# Patient Record
Sex: Female | Born: 1990 | Race: Asian | Hispanic: No | Marital: Single | Smoking: Never smoker
Health system: Southern US, Community
[De-identification: ages and names within clinical notes are randomized; demographics above are authoritative.]

## PROBLEM LIST (undated history)

## (undated) ENCOUNTER — Inpatient Hospital Stay (HOSPITAL_COMMUNITY): Payer: Self-pay

## (undated) DIAGNOSIS — D242 Benign neoplasm of left breast: Secondary | ICD-10-CM

## (undated) DIAGNOSIS — Z332 Encounter for elective termination of pregnancy: Secondary | ICD-10-CM

## (undated) DIAGNOSIS — R519 Headache, unspecified: Secondary | ICD-10-CM

## (undated) DIAGNOSIS — R51 Headache: Secondary | ICD-10-CM

## (undated) HISTORY — PX: THERAPEUTIC ABORTION: SHX798

## (undated) HISTORY — PX: WISDOM TOOTH EXTRACTION: SHX21

---

## 2011-10-28 NOTE — L&D Delivery Note (Signed)
Delivery Note Pt developed a fever to 101 preceded by fetal tachycardia, received one dose of Unasyn for probable chorioamnionitis.  She progressed to complete and pushed well.  At 12:31 AM a viable female was delivered via Vaginal, Spontaneous Delivery (Presentation: Right Occiput Anterior).  APGAR: 8, 9; weight pending.  Terminal meconium.   Placenta status: Intact, Spontaneous Pathology.  Cord: 3 vessels with the following complications: None.  Anesthesia: Epidural  Episiotomy: None Lacerations: 2nd degree;Perineal Suture Repair: 3.0 vicryl rapide Est. Blood Loss (mL): 400  Mom to postpartum.  Baby to stay with mom. Declines circumcision.  Will give one more dose of Unasyn, then d/c, send placenta to pathology.  Montae Stager D 10/26/2012, 12:57 AM

## 2012-02-16 ENCOUNTER — Inpatient Hospital Stay (HOSPITAL_COMMUNITY)
Admission: AD | Admit: 2012-02-16 | Discharge: 2012-02-16 | Disposition: A | Discharge status: Home / Self Care | Payer: Medicaid Other | Source: Ambulatory Visit | Attending: Obstetrics and Gynecology | Admitting: Obstetrics and Gynecology

## 2012-02-16 ENCOUNTER — Inpatient Hospital Stay (HOSPITAL_COMMUNITY): Payer: Medicaid Other

## 2012-02-16 ENCOUNTER — Encounter (HOSPITAL_COMMUNITY): Payer: Self-pay | Admitting: *Deleted

## 2012-02-16 DIAGNOSIS — O26899 Other specified pregnancy related conditions, unspecified trimester: Secondary | ICD-10-CM

## 2012-02-16 DIAGNOSIS — R109 Unspecified abdominal pain: Secondary | ICD-10-CM

## 2012-02-16 DIAGNOSIS — O99891 Other specified diseases and conditions complicating pregnancy: Secondary | ICD-10-CM | POA: Insufficient documentation

## 2012-02-16 LAB — URINALYSIS, ROUTINE W REFLEX MICROSCOPIC
Glucose, UA: NEGATIVE mg/dL
Hgb urine dipstick: NEGATIVE
Ketones, ur: NEGATIVE mg/dL
Leukocytes, UA: NEGATIVE
Protein, ur: NEGATIVE mg/dL
pH: 6.5 (ref 5.0–8.0)

## 2012-02-16 LAB — WET PREP, GENITAL
Clue Cells Wet Prep HPF POC: NONE SEEN
Trich, Wet Prep: NONE SEEN

## 2012-02-16 LAB — DIFFERENTIAL
Basophils Absolute: 0 10*3/uL (ref 0.0–0.1)
Basophils Relative: 0 % (ref 0–1)
Eosinophils Relative: 1 % (ref 0–5)
Monocytes Absolute: 0.8 10*3/uL (ref 0.1–1.0)
Neutro Abs: 8.8 10*3/uL — ABNORMAL HIGH (ref 1.7–7.7)

## 2012-02-16 LAB — CBC
HCT: 41.9 % (ref 36.0–46.0)
MCHC: 33.4 g/dL (ref 30.0–36.0)
MCV: 84.3 fL (ref 78.0–100.0)
Platelets: 316 10*3/uL (ref 150–400)
RDW: 12.2 % (ref 11.5–15.5)
WBC: 13 10*3/uL — ABNORMAL HIGH (ref 4.0–10.5)

## 2012-02-16 NOTE — Discharge Instructions (Signed)
Abdominal Pain During Pregnancy  Abdominal discomfort is common in pregnancy. Most of the time, it does not cause harm. There are many causes of abdominal pain. Some causes are more serious than others. Some of the causes of abdominal pain in pregnancy are easily diagnosed. Occasionally, the diagnosis takes time to understand. Other times, the cause is not determined. Abdominal pain can be a sign that something is very wrong with the pregnancy, or the pain may have nothing to do with the pregnancy at all. For this reason, always tell your caregiver if you have any abdominal discomfort.  CAUSES  Common and harmless causes of abdominal pain include:   Constipation.   Excess gas and bloating.   Round ligament pain. This is pain that is felt in the folds of the groin.   The position the baby or placenta is in.   Baby kicks.   Braxton-Hicks contractions. These are mild contractions that do not cause cervical dilation.  Serious causes of abdominal pain include:   Ectopic pregnancy. This happens when a fertilized egg implants outside of the uterus.   Miscarriage.   Preterm labor. This is when labor starts at less than 37 weeks of pregnancy.   Placental abruption. This is when the placenta partially or completely separates from the uterus.   Preeclampsia. This is often associated with high blood pressure and has been referred to as "toxemia in pregnancy."   Uterine or amniotic fluid infections.  Causes unrelated to pregnancy include:   Urinary tract infection.   Gallbladder stones or inflammation.   Hepatitis or other liver illness.   Intestinal problems, stomach flu, food poisoning, or ulcer.   Appendicitis.   Kidney (renal) stones.   Kidney infection (pylonephritis).  HOME CARE INSTRUCTIONS   For mild pain:   Do not have sexual intercourse or put anything in your vagina until your symptoms go away completely.   Get plenty of rest until your pain improves. If your pain does not improve in 1 hour, call  your caregiver.   Drink clear fluids if you feel nauseous. Avoid solid food as long as you are uncomfortable or nauseous.   Only take medicine as directed by your caregiver.   Keep all follow-up appointments with your caregiver.  SEEK IMMEDIATE MEDICAL CARE IF:   You are bleeding, leaking fluid, or passing tissue from the vagina.   You have increasing pain or cramping.   You have persistent vomiting.   You have painful or bloody urination.   You have a fever.   You notice a decrease in your baby's movements.   You have extreme weakness or feel faint.   You have shortness of breath, with or without abdominal pain.   You develop a severe headache with abdominal pain.   You have abnormal vaginal discharge with abdominal pain.   You have persistent diarrhea.   You have abdominal pain that continues even after rest, or gets worse.  MAKE SURE YOU:    Understand these instructions.   Will watch your condition.   Will get help right away if you are not doing well or get worse.  Document Released: 10/13/2005 Document Revised: 10/02/2011 Document Reviewed: 05/09/2011  [employer] Patient Information 2012 [employer].

## 2012-02-16 NOTE — MAU Note (Signed)
abd cramping, ongoing past 3 wks.   [facility]

## 2012-02-16 NOTE — MAU Provider Note (Signed)
History     CSN: [id number]  Arrival date and time: 02/16/12 1107   First Provider Initiated Contact with Patient 02/16/12 1208      Chief Complaint  Patient presents with  . Abdominal Pain   HPI Jessica Warren is 21 y.o. G2P0010 [redacted]w[redacted]d weeks presenting with cramping that are more uncomfortable than period cramp. Bearable but "I have to pause"  Rates 5-6/10 when this happens.  Denies vaginal bleeding or discharge. Denies nausea and vomiting.   Unplanned pregnancy, not using contraception.  Medicaid is pending.  She hasn't decided where to get care.    Past Medical History  Diagnosis Date  . No pertinent past medical history     Past Surgical History  Procedure Date  . No past surgeries     History reviewed. No pertinent family history.  History  Substance Use Topics  . Smoking status: Never Smoker   . Smokeless tobacco: Not on file  . Alcohol Use: No    Allergies: No Known Allergies  Prescriptions prior to admission  Medication Sig Dispense Refill  . hydrocortisone cream 1 % Apply 1 application topically 2 (two) times daily as needed. For eczema      . Prenatal Vit-Fe Fumarate-FA (PRENATAL MULTIVITAMIN) TABS Take 1 tablet by mouth daily.        Review of Systems  Constitutional: Negative.   Gastrointestinal: Positive for nausea and abdominal pain (intermittent sharp, lower abdomen, occ upper abd). Negative for vomiting.  Genitourinary:       Neg for vaginal discharge or bleeding   Physical Exam   Blood pressure 133/67, pulse 78, temperature 98.3 F (36.8 C), temperature source Oral, resp. rate 20, height 5' 1.5" (1.562 m), weight 78.926 kg (174 lb), last menstrual period 12/18/2011.  Physical Exam  Constitutional: She is oriented to person, place, and time. She appears well-developed and well-nourished. No distress.  HENT:  Head: Normocephalic.  Neck: Normal range of motion.  Respiratory: Effort normal.  GI: Soft. She exhibits no mass. There is no  tenderness. There is no rebound and no guarding.  Genitourinary: Uterus is enlarged (slight enlargement). Uterus is not tender. No bleeding around the vagina. Vaginal discharge (scant, white without odor) found.  Neurological: She is alert and oriented to person, place, and time.  Skin: Skin is dry.  Psychiatric: She has a normal mood and affect. Her behavior is normal. Judgment and thought content normal.   Results for orders placed during the hospital encounter of 02/16/12 (from the past 24 hour(s))  URINALYSIS, ROUTINE W REFLEX MICROSCOPIC     Status: Normal   Collection Time   02/16/12 11:25 AM      Component Value Range   Color, Urine YELLOW  YELLOW    APPearance CLEAR  CLEAR    Specific Gravity, Urine 1.010  1.005 - 1.030    pH 6.5  5.0 - 8.0    Glucose, UA NEGATIVE  NEGATIVE (mg/dL)   Hgb urine dipstick NEGATIVE  NEGATIVE    Bilirubin Urine NEGATIVE  NEGATIVE    Ketones, ur NEGATIVE  NEGATIVE (mg/dL)   Protein, ur NEGATIVE  NEGATIVE (mg/dL)   Urobilinogen, UA 0.2  0.0 - 1.0 (mg/dL)   Nitrite NEGATIVE  NEGATIVE    Leukocytes, UA NEGATIVE  NEGATIVE   POCT PREGNANCY, URINE     Status: Abnormal   Collection Time   02/16/12 11:28 AM      Component Value Range   Preg Test, Ur POSITIVE (*) NEGATIVE   WET  PREP, GENITAL     Status: Abnormal   Collection Time   02/16/12 12:20 PM      Component Value Range   Yeast Wet Prep HPF POC NONE SEEN  NONE SEEN    Trich, Wet Prep NONE SEEN  NONE SEEN    Clue Cells Wet Prep HPF POC NONE SEEN  NONE SEEN    WBC, Wet Prep HPF POC FEW (*) NONE SEEN   CBC     Status: Abnormal   Collection Time   02/16/12 12:50 PM      Component Value Range   WBC 13.0 (*) 4.0 - 10.5 (K/uL)   RBC 4.97  3.87 - 5.11 (MIL/uL)   Hemoglobin 14.0  12.0 - 15.0 (g/dL)   HCT 16.1  09.6 - 04.5 (%)   MCV 84.3  78.0 - 100.0 (fL)   MCH 28.2  26.0 - 34.0 (pg)   MCHC 33.4  30.0 - 36.0 (g/dL)   RDW 40.9  81.1 - 91.4 (%)   Platelets 316  150 - 400 (K/uL)  DIFFERENTIAL      Status: Abnormal   Collection Time   02/16/12 12:50 PM      Component Value Range   Neutrophils Relative 68  43 - 77 (%)   Neutro Abs 8.8 (*) 1.7 - 7.7 (K/uL)   Lymphocytes Relative 25  12 - 46 (%)   Lymphs Abs 3.3  0.7 - 4.0 (K/uL)   Monocytes Relative 6  3 - 12 (%)   Monocytes Absolute 0.8  0.1 - 1.0 (K/uL)   Eosinophils Relative 1  0 - 5 (%)   Eosinophils Absolute 0.1  0.0 - 0.7 (K/uL)   Basophils Relative 0  0 - 1 (%)   Basophils Absolute 0.0  0.0 - 0.1 (K/uL)   ULTRASOUND:  Probably early IUGS, No YS or FP.  [redacted]w[redacted]d.  Simple cyst on right measuring 2.7cm.  EDD 10/19/12.  Recommendation to repeat U/S in 2 weeks  MAU Course  Procedures  GC/CHL to lab MDM  14:53 Reviewed plan of care for followup with Dr. Jolayne Panther, she agreed with 2 week follow up ultrasound.  BY LMP patient should have been [redacted]w[redacted]d but by U/S measurements is [redacted]w[redacted]d.  Patient has not had vaginal bleeding.  Will repeat ultrasound in 2 weeks for viability  Assessment and Plan  A:  Abdominal pain in early pregnancy  P:  Repeat U/S in 2 weeks         Instructed patient  To return for increased pain, vaginal bleeding.         Kellen Hover,EVE M 02/16/2012, 12:11 PM

## 2012-02-16 NOTE — MAU Note (Signed)
Denies any pain or burning with urination. No GI complaints.

## 2012-02-17 LAB — GC/CHLAMYDIA PROBE AMP, GENITAL: Chlamydia, DNA Probe: NEGATIVE

## 2012-02-17 NOTE — MAU Provider Note (Signed)
Agree with above note.  Jessica Warren 02/17/2012 6:54 AM

## 2012-03-01 ENCOUNTER — Inpatient Hospital Stay (HOSPITAL_COMMUNITY)
Admission: AD | Admit: 2012-03-01 | Discharge: 2012-03-01 | Disposition: A | Discharge status: Home / Self Care | Payer: Medicaid Other | Source: Ambulatory Visit | Attending: Obstetrics & Gynecology | Admitting: Obstetrics & Gynecology

## 2012-03-01 ENCOUNTER — Ambulatory Visit (HOSPITAL_COMMUNITY)
Admission: RE | Admit: 2012-03-01 | Discharge: 2012-03-01 | Disposition: A | Discharge status: Home / Self Care | Payer: Medicaid Other | Source: Ambulatory Visit | Attending: Gynecology | Admitting: Gynecology

## 2012-03-01 ENCOUNTER — Other Ambulatory Visit (HOSPITAL_COMMUNITY): Payer: Self-pay | Admitting: Gynecology

## 2012-03-01 ENCOUNTER — Ambulatory Visit (HOSPITAL_COMMUNITY): Payer: Medicaid Other

## 2012-03-01 ENCOUNTER — Encounter (HOSPITAL_COMMUNITY): Payer: Self-pay

## 2012-03-01 DIAGNOSIS — O26899 Other specified pregnancy related conditions, unspecified trimester: Secondary | ICD-10-CM

## 2012-03-01 DIAGNOSIS — Z1389 Encounter for screening for other disorder: Secondary | ICD-10-CM

## 2012-03-01 DIAGNOSIS — Z3490 Encounter for supervision of normal pregnancy, unspecified, unspecified trimester: Secondary | ICD-10-CM

## 2012-03-01 DIAGNOSIS — O26859 Spotting complicating pregnancy, unspecified trimester: Secondary | ICD-10-CM | POA: Insufficient documentation

## 2012-03-01 DIAGNOSIS — O99891 Other specified diseases and conditions complicating pregnancy: Secondary | ICD-10-CM | POA: Insufficient documentation

## 2012-03-01 DIAGNOSIS — O3680X0 Pregnancy with inconclusive fetal viability, not applicable or unspecified: Secondary | ICD-10-CM | POA: Insufficient documentation

## 2012-03-01 DIAGNOSIS — O209 Hemorrhage in early pregnancy, unspecified: Secondary | ICD-10-CM | POA: Insufficient documentation

## 2012-03-01 DIAGNOSIS — R109 Unspecified abdominal pain: Secondary | ICD-10-CM

## 2012-03-01 NOTE — MAU Provider Note (Signed)
Jessica Warren is a 21 y.o. female who returns to MAU for follow up ultrasound for viability. She reports no pain and minimal brown spotting.  Ultrasound today shows a 6 week 2 day viable IUP. She will start prenatal care.  I have reviewed this patient's vital signs, nurses notes, appropriate labs and imaging.

## 2012-03-01 NOTE — MAU Note (Signed)
Patient to MAU after ultrasound to confirm viability. Patient state she has brown spotting on and off but no pain.

## 2012-10-25 ENCOUNTER — Encounter (HOSPITAL_COMMUNITY): Payer: Self-pay | Admitting: *Deleted

## 2012-10-25 ENCOUNTER — Inpatient Hospital Stay (HOSPITAL_COMMUNITY)
Admission: AD | Admit: 2012-10-25 | Discharge: 2012-10-27 | DRG: 774 | Disposition: A | Discharge status: Home / Self Care | Payer: Medicaid Other | Source: Ambulatory Visit | Attending: Obstetrics and Gynecology | Admitting: Obstetrics and Gynecology

## 2012-10-25 ENCOUNTER — Inpatient Hospital Stay (HOSPITAL_COMMUNITY): Payer: Medicaid Other | Admitting: Anesthesiology

## 2012-10-25 ENCOUNTER — Encounter (HOSPITAL_COMMUNITY): Payer: Self-pay | Admitting: Anesthesiology

## 2012-10-25 DIAGNOSIS — IMO0001 Reserved for inherently not codable concepts without codable children: Secondary | ICD-10-CM

## 2012-10-25 DIAGNOSIS — O411090 Infection of amniotic sac and membranes, unspecified, unspecified trimester, not applicable or unspecified: Secondary | ICD-10-CM | POA: Diagnosis present

## 2012-10-25 LAB — OB RESULTS CONSOLE HIV ANTIBODY (ROUTINE TESTING): HIV: NONREACTIVE

## 2012-10-25 LAB — CBC
HCT: 40 % (ref 36.0–46.0)
MCH: 29.8 pg (ref 26.0–34.0)
MCV: 87 fL (ref 78.0–100.0)
RBC: 4.6 MIL/uL (ref 3.87–5.11)
WBC: 18.2 10*3/uL — ABNORMAL HIGH (ref 4.0–10.5)

## 2012-10-25 LAB — OB RESULTS CONSOLE ABO/RH: RH Type: POSITIVE

## 2012-10-25 LAB — OB RESULTS CONSOLE RUBELLA ANTIBODY, IGM: Rubella: IMMUNE

## 2012-10-25 LAB — OB RESULTS CONSOLE GBS: GBS: NEGATIVE

## 2012-10-25 LAB — PREPARE RBC (CROSSMATCH)

## 2012-10-25 MED ORDER — OXYCODONE-ACETAMINOPHEN 5-325 MG PO TABS: 1.0000 | ORAL_TABLET | ORAL | Status: DC | PRN

## 2012-10-25 MED ORDER — BUTORPHANOL TARTRATE 1 MG/ML IJ SOLN
1.0000 mg | INTRAMUSCULAR | Status: DC | PRN
  Administered 2012-10-25: 1 mg via INTRAVENOUS

## 2012-10-25 MED ORDER — LACTATED RINGERS IV SOLN: 500.0000 mL | Freq: Once | INTRAVENOUS | Status: DC

## 2012-10-25 MED ORDER — CITRIC ACID-SODIUM CITRATE 334-500 MG/5ML PO SOLN: 30.0000 mL | ORAL | Status: DC | PRN

## 2012-10-25 MED ORDER — ONDANSETRON HCL 4 MG/2ML IJ SOLN: 4.0000 mg | Freq: Four times a day (QID) | INTRAMUSCULAR | Status: DC | PRN

## 2012-10-25 MED ORDER — BUTORPHANOL TARTRATE 1 MG/ML IJ SOLN
INTRAMUSCULAR | Status: AC
  Administered 2012-10-25: 1 mg via INTRAVENOUS
  Filled 2012-10-25: qty 1

## 2012-10-25 MED ORDER — PHENYLEPHRINE 40 MCG/ML (10ML) SYRINGE FOR IV PUSH (FOR BLOOD PRESSURE SUPPORT): 80.0000 ug | PREFILLED_SYRINGE | INTRAVENOUS | Status: DC | PRN

## 2012-10-25 MED ORDER — LACTATED RINGERS IV SOLN
INTRAVENOUS | Status: DC
  Administered 2012-10-25 (×3): via INTRAVENOUS

## 2012-10-25 MED ORDER — DIPHENHYDRAMINE HCL 50 MG/ML IJ SOLN: 12.5000 mg | INTRAMUSCULAR | Status: DC | PRN

## 2012-10-25 MED ORDER — IBUPROFEN 600 MG PO TABS
600.0000 mg | ORAL_TABLET | Freq: Four times a day (QID) | ORAL | Status: DC | PRN
  Administered 2012-10-26: 600 mg via ORAL
  Filled 2012-10-25: qty 1

## 2012-10-25 MED ORDER — SODIUM CHLORIDE 0.9 % IV SOLN
3.0000 g | Freq: Once | INTRAVENOUS | Status: AC
  Administered 2012-10-25: 3 g via INTRAVENOUS
  Filled 2012-10-25: qty 3

## 2012-10-25 MED ORDER — PHENYLEPHRINE 40 MCG/ML (10ML) SYRINGE FOR IV PUSH (FOR BLOOD PRESSURE SUPPORT)
80.0000 ug | PREFILLED_SYRINGE | INTRAVENOUS | Status: DC | PRN
  Filled 2012-10-25: qty 5

## 2012-10-25 MED ORDER — FENTANYL 2.5 MCG/ML BUPIVACAINE 1/10 % EPIDURAL INFUSION (WH - ANES)
14.0000 mL/h | INTRAMUSCULAR | Status: DC
  Administered 2012-10-25 (×2): 14 mL/h via EPIDURAL
  Filled 2012-10-25 (×2): qty 125

## 2012-10-25 MED ORDER — ACETAMINOPHEN 325 MG PO TABS
650.0000 mg | ORAL_TABLET | ORAL | Status: DC | PRN
  Filled 2012-10-25: qty 2

## 2012-10-25 MED ORDER — EPHEDRINE 5 MG/ML INJ
10.0000 mg | INTRAVENOUS | Status: DC | PRN
  Filled 2012-10-25: qty 4

## 2012-10-25 MED ORDER — OXYTOCIN BOLUS FROM INFUSION
500.0000 mL | INTRAVENOUS | Status: DC
  Administered 2012-10-26: 500 mL via INTRAVENOUS

## 2012-10-25 MED ORDER — LIDOCAINE HCL (PF) 1 % IJ SOLN
30.0000 mL | INTRAMUSCULAR | Status: DC | PRN
  Filled 2012-10-25: qty 30

## 2012-10-25 MED ORDER — LACTATED RINGERS IV SOLN
500.0000 mL | INTRAVENOUS | Status: DC | PRN
  Administered 2012-10-25: 500 mL via INTRAVENOUS

## 2012-10-25 MED ORDER — SODIUM BICARBONATE 8.4 % IV SOLN
INTRAVENOUS | Status: DC | PRN
  Administered 2012-10-25: 5 mL via EPIDURAL

## 2012-10-25 MED ORDER — OXYTOCIN 40 UNITS IN LACTATED RINGERS INFUSION - SIMPLE MED
62.5000 mL/h | INTRAVENOUS | Status: DC
  Filled 2012-10-25: qty 1000

## 2012-10-25 MED ORDER — ACETAMINOPHEN 500 MG PO TABS
1000.0000 mg | ORAL_TABLET | Freq: Once | ORAL | Status: AC
  Administered 2012-10-25: 1000 mg via ORAL
  Filled 2012-10-25: qty 2

## 2012-10-25 MED ORDER — EPHEDRINE 5 MG/ML INJ: 10.0000 mg | INTRAVENOUS | Status: DC | PRN

## 2012-10-25 NOTE — H&P (Signed)
Jessica Warren is a 21 y.o. female, G2 P0010, EGA 40+ weeks with EDC 12-28 presenting for evaluation of ctx.  In MAU, VE initially 1 cm, changed to 3 cm after walking.  Prenatal care essentially uncomplicated, see prenatal records for complete history.  Maternal Medical History:  Reason for admission: Reason for admission: contractions.  Contractions: Frequency: regular.   Perceived severity is moderate.    Fetal activity: Perceived fetal activity is normal.    Prenatal complications: no prenatal complications   OB History    Grav Para Term Preterm Abortions TAB SAB Ect Mult Living   2    1 1     0     Past Medical History  Diagnosis Date  . No pertinent past medical history    Past Surgical History  Procedure Date  . Therapeutic abortion    Family History: family history is negative for Other. Social History:  reports that she has never smoked. She has never used smokeless tobacco. She reports that she drinks alcohol. She reports that she does not use illicit drugs.   Prenatal Transfer Tool  Maternal Diabetes: No Genetic Screening: Normal Maternal Ultrasounds/Referrals: Normal Fetal Ultrasounds or other Referrals:  None Maternal Substance Abuse:  No Significant Maternal Medications:  None Significant Maternal Lab Results:  Lab values include: Group B Strep negative Other Comments:  None  Review of Systems  Respiratory: Negative.   Cardiovascular: Negative.    VE- 3-4/C/_1, vtx, AROM clear Dilation: 3 (tight) Effacement (%): 100 Station: -2 Exam by:: jolynn Blood pressure 112/65, pulse 78, temperature 98.3 F (36.8 C), temperature source Oral, resp. rate 18, height 5' 1.5" (1.562 m), weight 88.542 kg (195 lb 3.2 oz), last menstrual period 12/18/2011. Maternal Exam:  Uterine Assessment: Contraction strength is moderate.  Contraction frequency is regular.   Abdomen: Patient reports no abdominal tenderness. Estimated fetal weight is 8 lbs.   Fetal presentation:  vertex  Introitus: Normal vulva. Normal vagina.  Amniotic fluid character: clear.  Pelvis: adequate for delivery.   Cervix: Cervix evaluated by digital exam.     Fetal Exam Fetal Monitor Review: Mode: ultrasound.   Baseline rate: 130.  Variability: moderate (6-25 bpm).   Pattern: accelerations present and no decelerations.    Fetal State Assessment: Category I - tracings are normal.     Physical Exam  Constitutional: She appears well-developed and well-nourished.  Cardiovascular: Normal rate, regular rhythm and normal heart sounds.   No murmur heard. Respiratory: Breath sounds normal. No respiratory distress. She has no wheezes.  GI: Soft.       gravid    Prenatal labs: ABO, Rh: A/Positive/-- (12/30 0000) Antibody: Negative (12/30 0000) Rubella: Immune (12/30 0000) RPR: Nonreactive (12/30 0000)  HBsAg: Negative (12/30 0000)  HIV: Non-reactive (12/30 0000)  GBS:   Neg GCT:  Elevated, nl GTT  Assessment/Plan: IUP at 40+ weeks in early labor, AROM done for augmentation, monitor progress.     Faelyn Sigler D 10/25/2012, 1:11 PM

## 2012-10-25 NOTE — Anesthesia Procedure Notes (Signed)

## 2012-10-25 NOTE — Progress Notes (Signed)
Comfortable with epidural Afeb, VSS FHT- Cat I VE- 6/C/0, vtx Continue to monitor progress

## 2012-10-25 NOTE — Anesthesia Preprocedure Evaluation (Signed)

## 2012-10-25 NOTE — MAU Note (Signed)
Patient states she is having contractions every 5-6 minutes with bloody show. Denies leaking. States she has been feeling fetal movement.

## 2012-10-25 NOTE — MAU Note (Signed)
Regular contractions since 0530. Now every 5-6 min.  First preg, no problems with preg.  No bleeding or leaking.

## 2012-10-26 ENCOUNTER — Encounter (HOSPITAL_COMMUNITY): Payer: Self-pay | Admitting: Obstetrics

## 2012-10-26 MED ORDER — METHYLERGONOVINE MALEATE 0.2 MG/ML IJ SOLN: 0.2000 mg | INTRAMUSCULAR | Status: DC | PRN

## 2012-10-26 MED ORDER — DIBUCAINE 1 % RE OINT: 1.0000 "application " | TOPICAL_OINTMENT | RECTAL | Status: DC | PRN

## 2012-10-26 MED ORDER — PRENATAL MULTIVITAMIN CH
1.0000 | ORAL_TABLET | Freq: Every day | ORAL | Status: DC
  Administered 2012-10-26 – 2012-10-27 (×2): 1 via ORAL
  Filled 2012-10-26 (×2): qty 1

## 2012-10-26 MED ORDER — TETANUS-DIPHTH-ACELL PERTUSSIS 5-2.5-18.5 LF-MCG/0.5 IM SUSP: 0.5000 mL | Freq: Once | INTRAMUSCULAR | Status: DC

## 2012-10-26 MED ORDER — METHYLERGONOVINE MALEATE 0.2 MG PO TABS: 0.2000 mg | ORAL_TABLET | ORAL | Status: DC | PRN

## 2012-10-26 MED ORDER — MAGNESIUM HYDROXIDE 400 MG/5ML PO SUSP: 30.0000 mL | ORAL | Status: DC | PRN

## 2012-10-26 MED ORDER — OXYCODONE-ACETAMINOPHEN 5-325 MG PO TABS: 1.0000 | ORAL_TABLET | ORAL | Status: DC | PRN

## 2012-10-26 MED ORDER — SIMETHICONE 80 MG PO CHEW: 80.0000 mg | CHEWABLE_TABLET | ORAL | Status: DC | PRN

## 2012-10-26 MED ORDER — ZOLPIDEM TARTRATE 5 MG PO TABS: 5.0000 mg | ORAL_TABLET | Freq: Every evening | ORAL | Status: DC | PRN

## 2012-10-26 MED ORDER — MEASLES, MUMPS & RUBELLA VAC ~~LOC~~ INJ
0.5000 mL | INJECTION | Freq: Once | SUBCUTANEOUS | Status: DC
  Filled 2012-10-26: qty 0.5

## 2012-10-26 MED ORDER — SODIUM CHLORIDE 0.9 % IV SOLN
3.0000 g | Freq: Once | INTRAVENOUS | Status: AC
  Administered 2012-10-26: 3 g via INTRAVENOUS
  Filled 2012-10-26: qty 3

## 2012-10-26 MED ORDER — ONDANSETRON HCL 4 MG PO TABS: 4.0000 mg | ORAL_TABLET | ORAL | Status: DC | PRN

## 2012-10-26 MED ORDER — LANOLIN HYDROUS EX OINT: TOPICAL_OINTMENT | CUTANEOUS | Status: DC | PRN

## 2012-10-26 MED ORDER — WITCH HAZEL-GLYCERIN EX PADS: 1.0000 "application " | MEDICATED_PAD | CUTANEOUS | Status: DC | PRN

## 2012-10-26 MED ORDER — SENNOSIDES-DOCUSATE SODIUM 8.6-50 MG PO TABS
2.0000 | ORAL_TABLET | Freq: Every day | ORAL | Status: DC
  Administered 2012-10-26: 2 via ORAL

## 2012-10-26 MED ORDER — DIPHENHYDRAMINE HCL 25 MG PO CAPS: 25.0000 mg | ORAL_CAPSULE | Freq: Four times a day (QID) | ORAL | Status: DC | PRN

## 2012-10-26 MED ORDER — ONDANSETRON HCL 4 MG/2ML IJ SOLN: 4.0000 mg | INTRAMUSCULAR | Status: DC | PRN

## 2012-10-26 MED ORDER — BENZOCAINE-MENTHOL 20-0.5 % EX AERO
1.0000 "application " | INHALATION_SPRAY | CUTANEOUS | Status: DC | PRN
  Filled 2012-10-26: qty 56

## 2012-10-26 MED ORDER — IBUPROFEN 600 MG PO TABS
600.0000 mg | ORAL_TABLET | Freq: Four times a day (QID) | ORAL | Status: DC
  Administered 2012-10-26 – 2012-10-27 (×5): 600 mg via ORAL
  Filled 2012-10-26 (×5): qty 1

## 2012-10-26 NOTE — Progress Notes (Signed)
PPD #0 No problems Afeb-last temp 100.4 at 0130, VSS Fundus firm, NT at U-1 Continue routine postpartum care, monitor temp, is off antibiotics

## 2012-10-26 NOTE — Progress Notes (Signed)
UR chart review completed.  

## 2012-10-26 NOTE — Anesthesia Postprocedure Evaluation (Signed)
  Anesthesia Post-op Note  Patient: Jessica Warren  Procedure(s) Performed: * No procedures listed *  Patient Location: Mother/Baby  Anesthesia Type:Epidural  Level of Consciousness: awake, alert  and oriented  Airway and Oxygen Therapy: Patient Spontanous Breathing  Post-op Pain: mild  Post-op Assessment: Post-op Vital signs reviewed  Post-op Vital Signs: Reviewed and stable  Complications: No apparent anesthesia complications

## 2012-10-27 MED ORDER — IBUPROFEN 600 MG PO TABS: 600.0000 mg | ORAL_TABLET | Freq: Four times a day (QID) | ORAL | Status: DC

## 2012-10-27 MED ORDER — OXYCODONE-ACETAMINOPHEN 5-325 MG PO TABS: 1.0000 | ORAL_TABLET | ORAL | Status: DC | PRN

## 2012-10-27 NOTE — Progress Notes (Signed)
Post Partum Day 1 Subjective: no complaints and tolerating PO Pt requests d/c today if baby able to go  Objective: Blood pressure 112/70, pulse 81, temperature 97.6 F (36.4 C), temperature source Oral, resp. rate 18, height 5' 1.5" (1.562 m), weight 88.542 kg (195 lb 3.2 oz), last menstrual period 12/18/2011, unknown if currently breastfeeding.  Physical Exam:  General: alert and cooperative Lochia: appropriate Uterine Fundus: firm    Basename 10/25/12 1250  HGB 13.7  HCT 40.0    Assessment/Plan: Discharge home if baby able to go Motrin and percocet F/u 6 weeks   LOS: 2 days   Azia Toutant W 10/27/2012, 9:31 AM

## 2012-10-27 NOTE — Discharge Summary (Signed)
Obstetric Discharge Summary Reason for Admission: onset of labor Prenatal Procedures: none Intrapartum Procedures: spontaneous vaginal delivery, fever in labor Postpartum Procedures: antibiotics Complications-Operative and Postpartum: second degree perineal laceration Hemoglobin  Date Value Range Status  10/25/2012 13.7  12.0 - 15.0 g/dL Final     HCT  Date Value Range Status  10/25/2012 40.0  36.0 - 46.0 % Final    Physical Exam:  General: alert and cooperative Lochia: appropriate Uterine Fundus: firm   Discharge Diagnoses: Term Pregnancy-delivered        Chorioamnionitis in labor, treated with Unasyn Discharge Information: Date: 10/27/2012 Activity: pelvic rest Diet: routine Medications: Ibuprofen and Percocet Condition: improved Instructions: refer to practice specific booklet Discharge to: home Follow-up Information    Follow up with MEISINGER,TODD D, MD. Schedule an appointment as soon as possible for a visit in 6 weeks.   Contact information:   [address] [place] [address] [phone]          Newborn Data: Live born female  Birth Weight: 7 lb 6.4 oz (3357 g) APGAR: 8, 9  Home with mother.  Jessica Warren 10/27/2012, 9:34 AM

## 2012-10-28 LAB — TYPE AND SCREEN

## 2013-06-30 ENCOUNTER — Other Ambulatory Visit: Payer: Self-pay | Admitting: Radiology

## 2013-08-29 LAB — OB RESULTS CONSOLE GC/CHLAMYDIA
CHLAMYDIA, DNA PROBE: NEGATIVE
Gonorrhea: NEGATIVE

## 2013-08-29 LAB — OB RESULTS CONSOLE ABO/RH: RH Type: POSITIVE

## 2013-08-29 LAB — OB RESULTS CONSOLE RPR: RPR: NONREACTIVE

## 2013-08-29 LAB — OB RESULTS CONSOLE HIV ANTIBODY (ROUTINE TESTING): HIV: NONREACTIVE

## 2013-08-29 LAB — OB RESULTS CONSOLE HEPATITIS B SURFACE ANTIGEN: Hepatitis B Surface Ag: NEGATIVE

## 2013-08-29 LAB — OB RESULTS CONSOLE RUBELLA ANTIBODY, IGM: RUBELLA: IMMUNE

## 2013-08-29 LAB — OB RESULTS CONSOLE ANTIBODY SCREEN: Antibody Screen: NEGATIVE

## 2013-10-27 NOTE — L&D Delivery Note (Signed)
Delivery Note At 2:38 AM a viable and healthy female was delivered via  (Presentation: OA  ).  APGAR: 9, 9; weight P.   Placenta status:delivered, intact .  Cord: 3VC with the following complications: none  Anesthesia:  epidural Episiotomy:  none Lacerations: periurethral Suture Repair: 3.0 vicryl rapide Est. Blood Loss (mL): 400cc  Mom to postpartum.  Baby to Couplet care / Skin to Skin.  Salomon Ganser Bovard-Stuckert 03/24/2014, 2:59 AM  Bo/A+/Contra Mirena/ RI

## 2013-12-27 LAB — OB RESULTS CONSOLE GBS: GBS: POSITIVE

## 2014-02-24 IMAGING — US US OB TRANSVAGINAL
1 series · 14 of 25 positions shown · non-contrast
Comparison: none

[Series 1: us ob transvaginal · 14 of 25 slices shown]
[im 1/25]
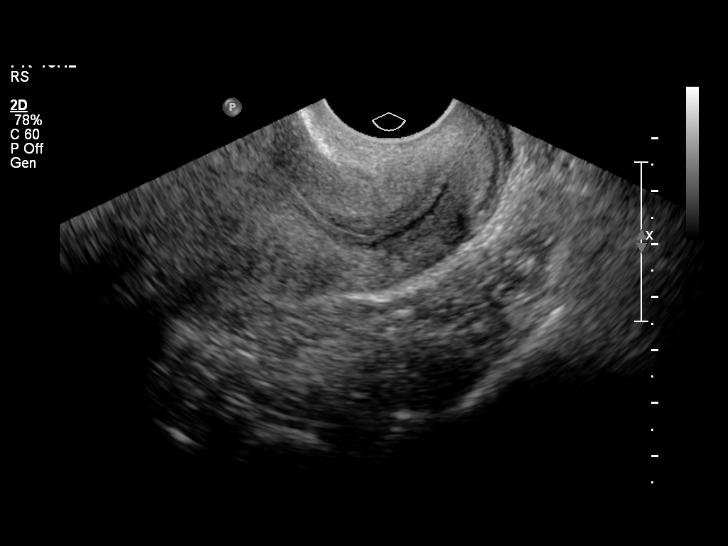
[im 3/25]
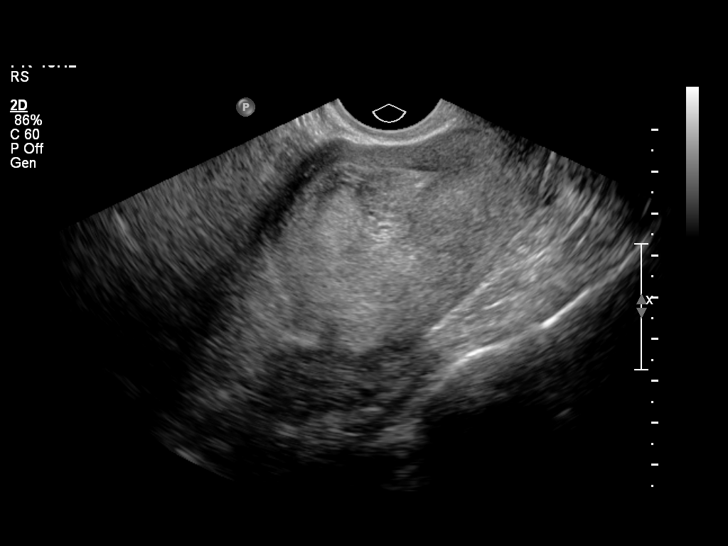
[im 5/25]
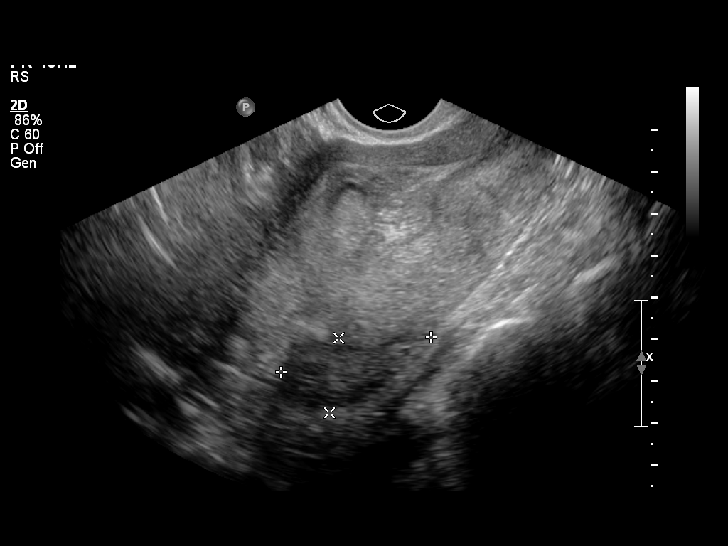
[im 7/25]
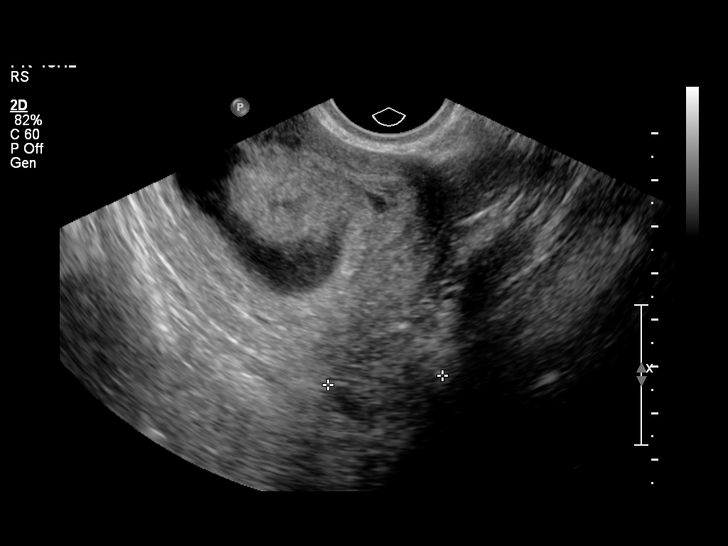
[im 9/25]
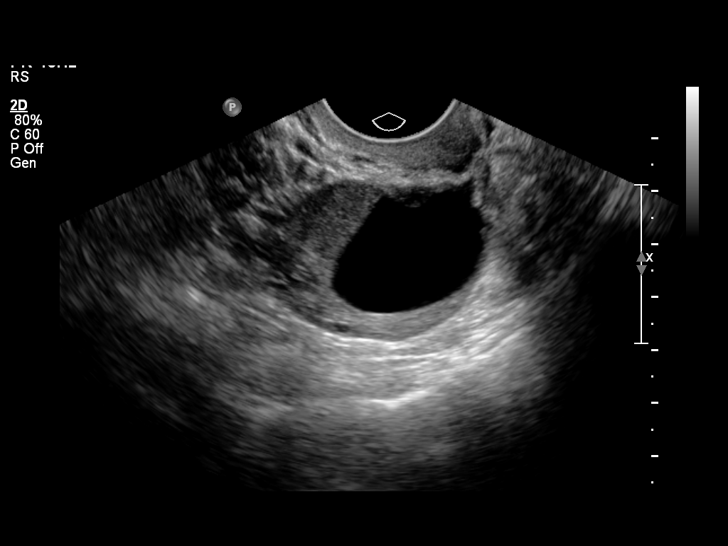
[im 10/25]
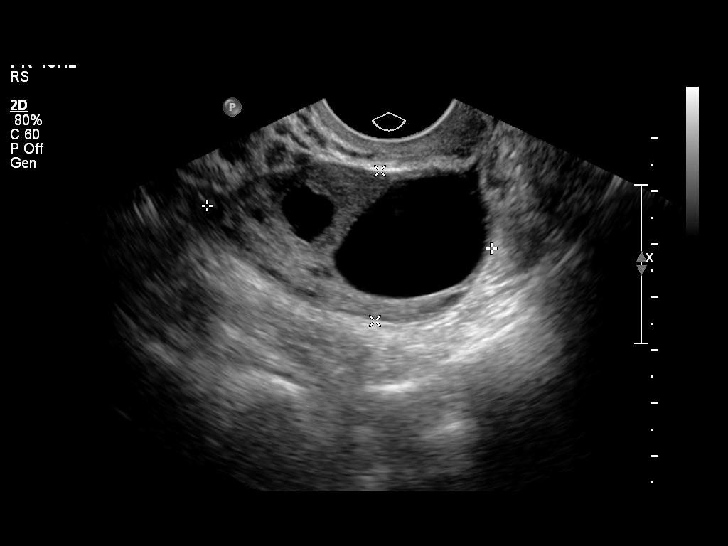
[im 12/25]
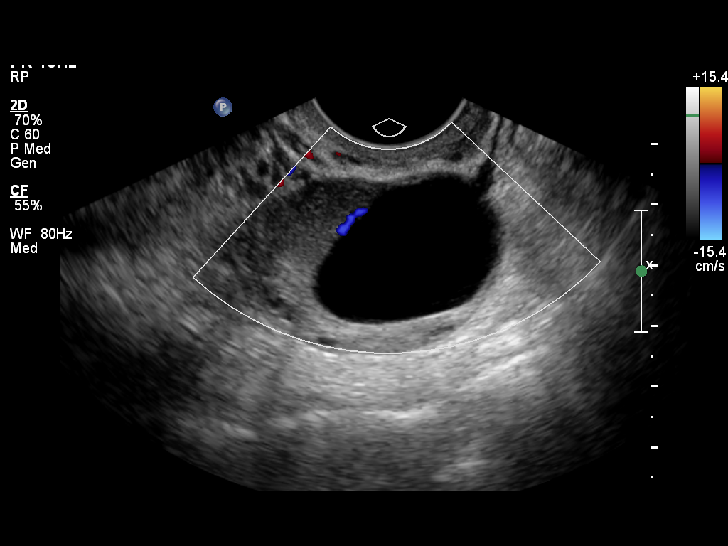
[im 14/25]
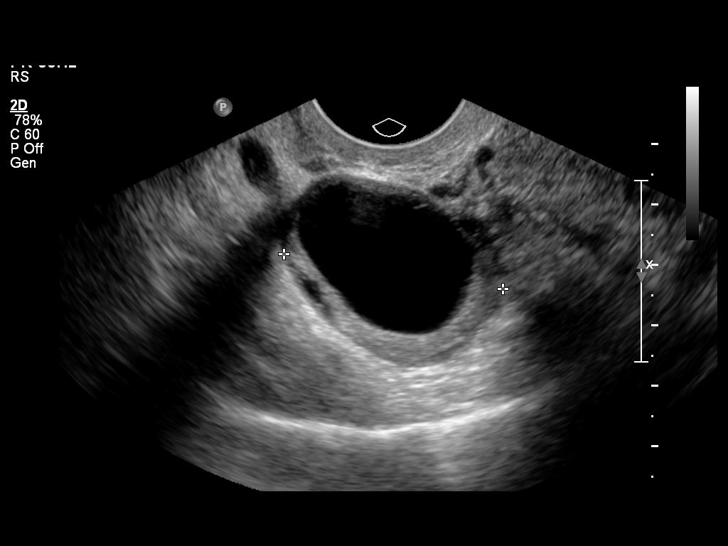
[im 16/25]
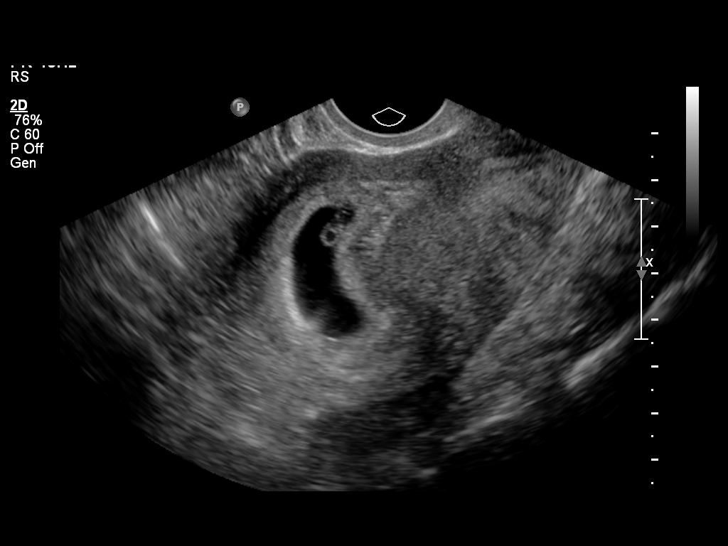
[im 17/25]
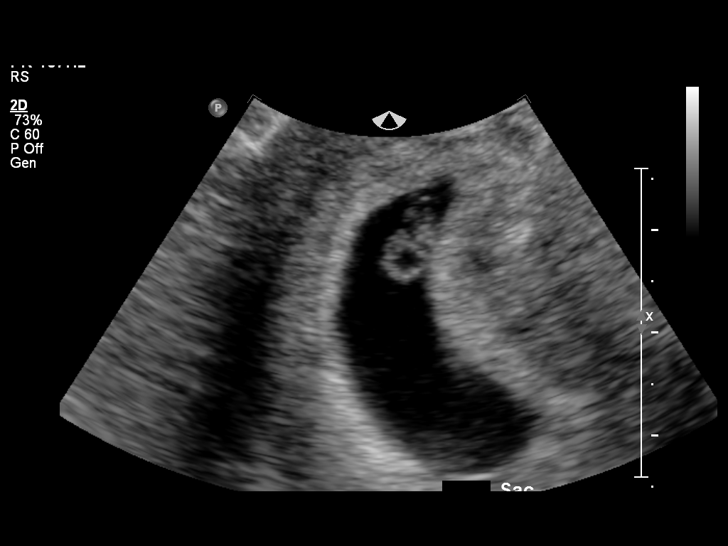
[im 19/25]
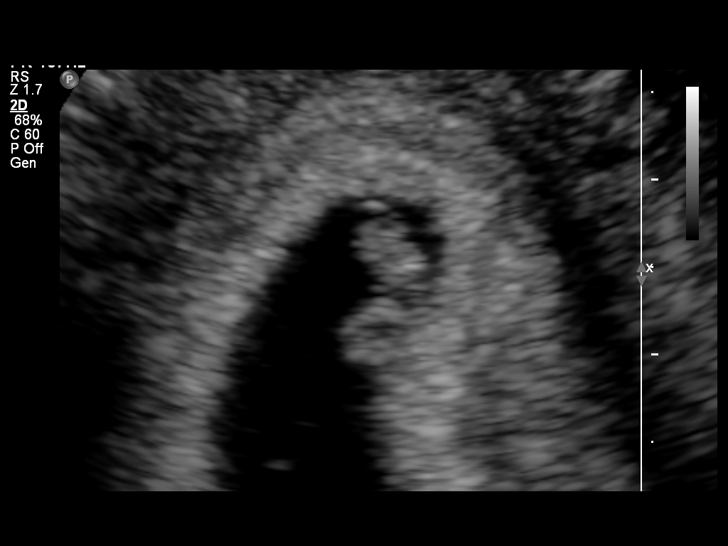
[im 21/25]
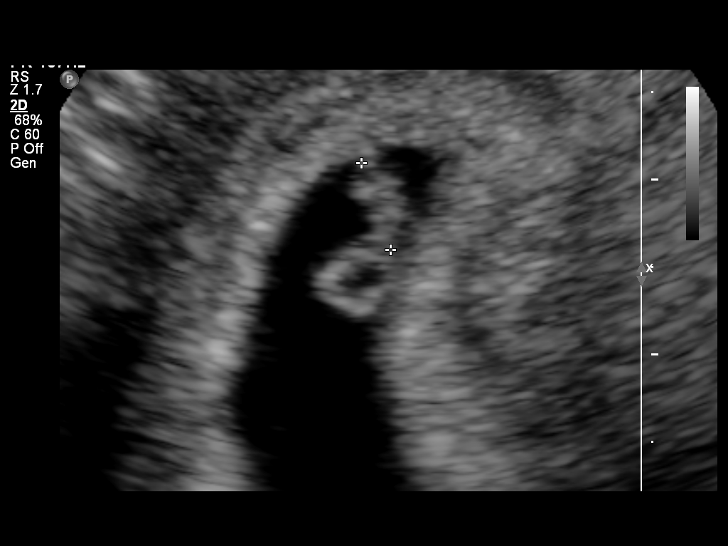
[im 23/25]
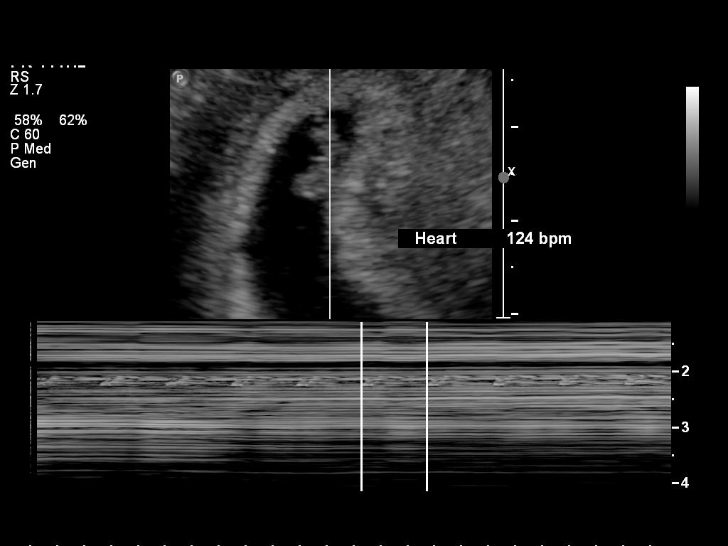
[im 25/25]
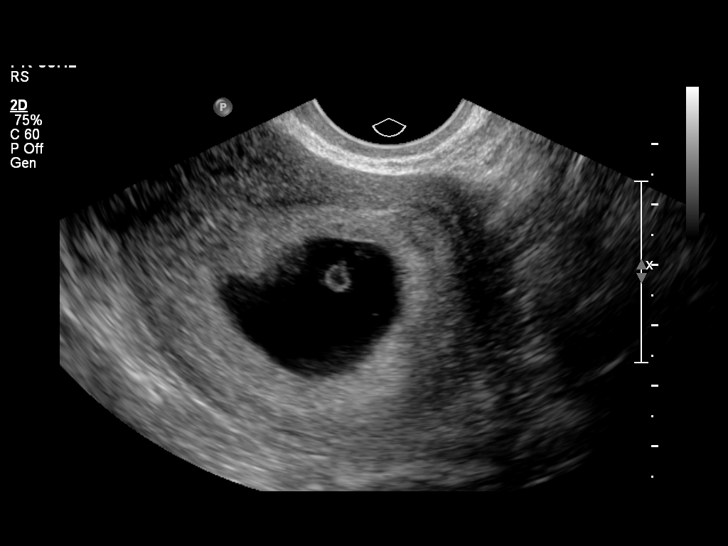

[14 of 25 positions shown; findings below may reference images not displayed]

OBSTETRICS REPORT
                      (Signed Final 03/01/2012 [DATE])

 Order#:         [id number]
Procedures

 US OB TRANSVAGINAL                                    76817.0
Indications

 Pregnancy with inconclusive fetal viability
 Vaginal bleeding, unknown etiology
Fetal Evaluation

 Preg. Location:    Intrauterine
 Gest. Sac:         Intrauterine
 Yolk Sac:          Visualized
 Fetal Pole:        Visualized
 Fetal Heart Rate:  124                         bpm
 Cardiac Activity:  Observed
Biometry

 CRL:      5.3  mm    G. Age:   6w 2d                  EDD:   10/23/12
Gestational Age

 LMP:           10w 4d       Date:   12/18/11                 EDD:   09/23/12
 Best:          6w 2d     Det. By:   U/S C R L (03/01/12)     EDD:   10/23/12
Cervix Uterus Adnexa

 Left Ovary:   Size(cm) L: 3.68 x W: 2.46 x H: 1.8  Volume(cc):
 Right Ovary:  Size(cm) L: 5.43 x W: 3.67 x H: 2.83  Volume(cc):
               29.5  Small corpus luteum noted.
Impression

 Intrauterine gestational sac, yolk sac, fetal pole, and cardiac
 activity noted.  6w2d by today's CRL. No acute abnormality.

 questions or concerns.

## 2014-03-22 ENCOUNTER — Encounter (HOSPITAL_COMMUNITY): Payer: Self-pay | Admitting: *Deleted

## 2014-03-22 ENCOUNTER — Inpatient Hospital Stay (HOSPITAL_COMMUNITY)
Admission: AD | Admit: 2014-03-22 | Discharge: 2014-03-22 | Disposition: A | Discharge status: Home / Self Care | Payer: BC Managed Care – PPO | Source: Ambulatory Visit | Attending: Obstetrics and Gynecology | Admitting: Obstetrics and Gynecology

## 2014-03-22 DIAGNOSIS — O368190 Decreased fetal movements, unspecified trimester, not applicable or unspecified: Secondary | ICD-10-CM | POA: Insufficient documentation

## 2014-03-22 DIAGNOSIS — O368130 Decreased fetal movements, third trimester, not applicable or unspecified: Secondary | ICD-10-CM

## 2014-03-22 DIAGNOSIS — O4690 Antepartum hemorrhage, unspecified, unspecified trimester: Secondary | ICD-10-CM | POA: Insufficient documentation

## 2014-03-22 DIAGNOSIS — O479 False labor, unspecified: Secondary | ICD-10-CM | POA: Insufficient documentation

## 2014-03-22 NOTE — MAU Note (Signed)
States she has had decreased FM for a week. Had NST in office yesterday as well as cervical exam. States she was told last week she was 1cm and yesterday was told "a little bit more."

## 2014-03-22 NOTE — MAU Provider Note (Signed)
  History     CSN: [id number]  Arrival date and time: 03/22/14 1430   First Provider Initiated Contact with Patient 03/22/14 1531      Chief Complaint  Patient presents with  . Vaginal Bleeding  . Decreased Fetal Movement   HPI This is a 23 y.o. female at [redacted]w[redacted]d who presents with report of decreased fetal movement.   RN Note:  States she has had decreased FM for a week. Had NST in office yesterday as well as cervical exam. States she was told last week she was 1cm and yesterday was told "a little bit more."       OB History   Grav Para Term Preterm Abortions TAB SAB Ect Mult Living   3 2 2  1 1    2       Past Medical History  Diagnosis Date  . No pertinent past medical history     Past Surgical History  Procedure Laterality Date  . Therapeutic abortion      Family History  Problem Relation Age of Onset  . Other Neg Hx     History  Substance Use Topics  . Smoking status: Never Smoker   . Smokeless tobacco: Never Used  . Alcohol Use: Yes     Comment: none with preg    Allergies:  Allergies  Allergen Reactions  . Adhesive [Tape] Hives and Itching    Prescriptions prior to admission  Medication Sig Dispense Refill  . Prenatal Vit-Fe Fumarate-FA (PRENATAL MULTIVITAMIN) TABS Take 1 tablet by mouth daily.        Review of Systems  Constitutional: Negative for fever and chills.  Gastrointestinal: Negative for nausea, vomiting and abdominal pain (but occasional contractions).  Neurological: Negative for dizziness.   Physical Exam   Blood pressure 109/69, pulse 99, temperature 98.5 F (36.9 C), temperature source Oral, resp. rate 16, height 5\' 2"  (1.575 m), weight 89.812 kg (198 lb), unknown if currently breastfeeding.  Physical Exam  Constitutional: She is oriented to person, place, and time. She appears well-developed and well-nourished. No distress.  Cardiovascular: Normal rate.   Respiratory: Effort normal.  GI: Soft. There is no tenderness. There  is no rebound and no guarding.  Genitourinary:  Dilation: 2 Effacement (%): 80 Cervical Position: Posterior Station: -3 Presentation: Vertex Exam by:: Sadiyah Kangas CNM   Musculoskeletal: Normal range of motion.  Neurological: She is alert and oriented to person, place, and time.  Skin: Skin is warm and dry.  Psychiatric: She has a normal mood and affect.    MAU Course  Procedures  MDM Fetal heart rate reactive Irregular mild contractions  Assessment and Plan  A:  SIUP at [redacted]w[redacted]d      Decreased fetal movement      Reassuring category I FHR tracing  P:  Discharge home      Labor precautions  Seabron Spates 03/22/2014, 3:48 PM

## 2014-03-22 NOTE — Discharge Instructions (Signed)

## 2014-03-22 NOTE — MAU Note (Signed)
States she noted some bright red blood this morning, enough to come out into toilet, but just a small amount of pink on tissue. Now is brown. Baby not moving as much.

## 2014-03-23 ENCOUNTER — Inpatient Hospital Stay (HOSPITAL_COMMUNITY): Payer: BC Managed Care – PPO | Admitting: Anesthesiology

## 2014-03-23 ENCOUNTER — Inpatient Hospital Stay (HOSPITAL_COMMUNITY)
Admission: AD | Admit: 2014-03-23 | Discharge: 2014-03-26 | DRG: 775 | Disposition: A | Discharge status: Home / Self Care | Payer: BC Managed Care – PPO | Source: Ambulatory Visit | Attending: Obstetrics and Gynecology | Admitting: Obstetrics and Gynecology

## 2014-03-23 ENCOUNTER — Encounter (HOSPITAL_COMMUNITY): Payer: BC Managed Care – PPO | Admitting: Anesthesiology

## 2014-03-23 ENCOUNTER — Encounter (HOSPITAL_COMMUNITY): Payer: Self-pay | Admitting: General Practice

## 2014-03-23 DIAGNOSIS — Z22330 Carrier of Group B streptococcus: Secondary | ICD-10-CM

## 2014-03-23 DIAGNOSIS — O99892 Other specified diseases and conditions complicating childbirth: Secondary | ICD-10-CM | POA: Diagnosis present

## 2014-03-23 DIAGNOSIS — IMO0001 Reserved for inherently not codable concepts without codable children: Secondary | ICD-10-CM

## 2014-03-23 DIAGNOSIS — O9989 Other specified diseases and conditions complicating pregnancy, childbirth and the puerperium: Secondary | ICD-10-CM

## 2014-03-23 LAB — CBC
HEMATOCRIT: 43 % (ref 36.0–46.0)
HEMOGLOBIN: 14.3 g/dL (ref 12.0–15.0)
MCH: 28.8 pg (ref 26.0–34.0)
MCHC: 33.3 g/dL (ref 30.0–36.0)
MCV: 86.7 fL (ref 78.0–100.0)
Platelets: 243 10*3/uL (ref 150–400)
RBC: 4.96 MIL/uL (ref 3.87–5.11)
RDW: 14.1 % (ref 11.5–15.5)
WBC: 17.2 10*3/uL — ABNORMAL HIGH (ref 4.0–10.5)

## 2014-03-23 MED ORDER — PHENYLEPHRINE 40 MCG/ML (10ML) SYRINGE FOR IV PUSH (FOR BLOOD PRESSURE SUPPORT)
80.0000 ug | PREFILLED_SYRINGE | INTRAVENOUS | Status: DC | PRN
  Filled 2014-03-23: qty 2
  Filled 2014-03-23: qty 10

## 2014-03-23 MED ORDER — LACTATED RINGERS IV SOLN
500.0000 mL | Freq: Once | INTRAVENOUS | Status: AC
  Administered 2014-03-23: 500 mL via INTRAVENOUS

## 2014-03-23 MED ORDER — LIDOCAINE HCL (PF) 1 % IJ SOLN
30.0000 mL | INTRAMUSCULAR | Status: AC | PRN
  Administered 2014-03-24: 30 mL via SUBCUTANEOUS
  Filled 2014-03-23: qty 30

## 2014-03-23 MED ORDER — DIPHENHYDRAMINE HCL 50 MG/ML IJ SOLN: 12.5000 mg | INTRAMUSCULAR | Status: DC | PRN

## 2014-03-23 MED ORDER — EPHEDRINE 5 MG/ML INJ
10.0000 mg | INTRAVENOUS | Status: DC | PRN
Start: 2014-03-23 — End: 2014-03-26
  Filled 2014-03-23: qty 4
  Filled 2014-03-23: qty 2

## 2014-03-23 MED ORDER — OXYCODONE-ACETAMINOPHEN 5-325 MG PO TABS: 1.0000 | ORAL_TABLET | ORAL | Status: DC | PRN

## 2014-03-23 MED ORDER — ONDANSETRON HCL 4 MG/2ML IJ SOLN: 4.0000 mg | Freq: Four times a day (QID) | INTRAMUSCULAR | Status: DC | PRN

## 2014-03-23 MED ORDER — PENICILLIN G POTASSIUM 5000000 UNITS IJ SOLR
5.0000 10*6.[IU] | Freq: Once | INTRAVENOUS | Status: AC
  Administered 2014-03-23: 5 10*6.[IU] via INTRAVENOUS
  Filled 2014-03-23: qty 5

## 2014-03-23 MED ORDER — LACTATED RINGERS IV SOLN: 500.0000 mL | INTRAVENOUS | Status: DC | PRN

## 2014-03-23 MED ORDER — CITRIC ACID-SODIUM CITRATE 334-500 MG/5ML PO SOLN: 30.0000 mL | ORAL | Status: DC | PRN

## 2014-03-23 MED ORDER — FLEET ENEMA 7-19 GM/118ML RE ENEM: 1.0000 | ENEMA | RECTAL | Status: DC | PRN

## 2014-03-23 MED ORDER — EPHEDRINE 5 MG/ML INJ
10.0000 mg | INTRAVENOUS | Status: DC | PRN
  Filled 2014-03-23: qty 2

## 2014-03-23 MED ORDER — LIDOCAINE HCL (PF) 1 % IJ SOLN
INTRAMUSCULAR | Status: DC | PRN
  Administered 2014-03-23 (×4): 4 mL

## 2014-03-23 MED ORDER — PHENYLEPHRINE 40 MCG/ML (10ML) SYRINGE FOR IV PUSH (FOR BLOOD PRESSURE SUPPORT)
80.0000 ug | PREFILLED_SYRINGE | INTRAVENOUS | Status: DC | PRN
  Filled 2014-03-23: qty 2

## 2014-03-23 MED ORDER — LACTATED RINGERS IV SOLN
INTRAVENOUS | Status: DC
  Administered 2014-03-23: 22:00:00 via INTRAVENOUS

## 2014-03-23 MED ORDER — IBUPROFEN 600 MG PO TABS: 600.0000 mg | ORAL_TABLET | Freq: Four times a day (QID) | ORAL | Status: DC | PRN

## 2014-03-23 MED ORDER — PENICILLIN G POTASSIUM 5000000 UNITS IJ SOLR
2.5000 10*6.[IU] | INTRAVENOUS | Status: DC
  Filled 2014-03-23 (×4): qty 2.5

## 2014-03-23 MED ORDER — OXYTOCIN 40 UNITS IN LACTATED RINGERS INFUSION - SIMPLE MED
62.5000 mL/h | INTRAVENOUS | Status: DC
  Administered 2014-03-24: 62.5 mL/h via INTRAVENOUS
  Filled 2014-03-23: qty 1000

## 2014-03-23 MED ORDER — ACETAMINOPHEN 325 MG PO TABS: 650.0000 mg | ORAL_TABLET | ORAL | Status: DC | PRN

## 2014-03-23 MED ORDER — OXYTOCIN BOLUS FROM INFUSION
500.0000 mL | INTRAVENOUS | Status: DC
  Administered 2014-03-24: 500 mL via INTRAVENOUS

## 2014-03-23 MED ORDER — FENTANYL 2.5 MCG/ML BUPIVACAINE 1/10 % EPIDURAL INFUSION (WH - ANES)
14.0000 mL/h | INTRAMUSCULAR | Status: DC | PRN
  Administered 2014-03-23: 14 mL/h via EPIDURAL
  Filled 2014-03-23: qty 125

## 2014-03-23 NOTE — Anesthesia Preprocedure Evaluation (Signed)

## 2014-03-23 NOTE — MAU Note (Signed)
EDC is 03-27-14.  U/C's started at 1915 and every 1-2 min.  Was here yesterday, 2 cm.  Bloody show.  Good FM.

## 2014-03-23 NOTE — MAU Note (Signed)
Pt in for routine labor check. Denies ROM.

## 2014-03-23 NOTE — H&P (Signed)
Jessica Warren is a 23 y.o. female G3P1011 at 39+ who presents with contractions, increasing in o=intensity and frequency. No LOF, no VB, + ctx.  Relatively uncomplicated prenatal care.- previously evaluated breast lump.   . Maternal Medical History:  Reason for admission: Contractions.   Contractions: Onset was 6-12 hours ago.    Fetal activity: Perceived fetal activity is normal.    Prenatal Complications - Diabetes: none.    OB History   Grav Para Term Preterm Abortions TAB SAB Ect Mult Living   3 1 1  1 1    1     G1 5wk TAB G2 41wk SVD 7#8, SVD G3 present No abn pap No STD  Past Medical History  Diagnosis Date  . No pertinent past medical history    Past Surgical History  Procedure Laterality Date  . Therapeutic abortion     Family History: family history is negative for Other. Social History:  reports that she has never smoked. She has never used smokeless tobacco. She reports that she drinks alcohol. She reports that she does not use illicit drugs. Meds PNV All NKDA   Prenatal Transfer Tool  Maternal Diabetes: No Genetic Screening: Normal Maternal Ultrasounds/Referrals: Normal Fetal Ultrasounds or other Referrals:  None Maternal Substance Abuse:  No Significant Maternal Medications:  None Significant Maternal Lab Results:  Lab values include: Group B Strep positive Other Comments:  None  Review of Systems  Constitutional: Negative.   HENT: Negative.   Eyes: Negative.   Respiratory: Negative.   Cardiovascular: Negative.   Gastrointestinal: Negative.   Genitourinary: Negative.   Musculoskeletal: Negative.   Skin: Negative.   Neurological: Negative.   Psychiatric/Behavioral: Negative.     Dilation: 2.5 Effacement (%): 100 Station: -2 Exam by:: cheryl motte,  rn Blood pressure 129/80, pulse 78, temperature 98 F (36.7 C), temperature source Oral, resp. rate 18, height 5\' 2"  (1.575 m), weight 89.812 kg (198 lb), unknown if currently  breastfeeding. Maternal Exam:  Uterine Assessment: Contraction strength is moderate.  Contraction frequency is regular.   Abdomen: Fundal height is appropriate for gestation.   Fetal presentation: vertex  Introitus: Normal vulva. Normal vagina.  Pelvis: adequate for delivery.   Cervix: Cervix evaluated by digital exam.     Physical Exam  Constitutional: She is oriented to person, place, and time. She appears well-developed and well-nourished.  HENT:  Head: Normocephalic and atraumatic.  Cardiovascular: Normal rate and regular rhythm.   Respiratory: Effort normal and breath sounds normal. No respiratory distress. She has no wheezes.  GI: Soft. Bowel sounds are normal. She exhibits no distension. There is no tenderness.  Musculoskeletal: Normal range of motion.  Neurological: She is alert and oriented to person, place, and time.  Skin: Skin is warm and dry.  Psychiatric: She has a normal mood and affect. Her behavior is normal.    Prenatal labs: ABO, Rh:  A+ Antibody:  negative Rubella:  immune RPR:   NR HBsAg:   neg HIV:   neg GBS: Positive (03/03 0000)   Hgb 14.2/ Plt 336K/UrCx + GBBS/GC neg/ Chl neg/glucola 121/AFP WNL  [place] dates preg, apx 10 wk  T dap 12/27/13 Nl anat Assessment/Plan: 23yo G3P1011 at 39+ in early labor gbbs for prophylaxis Expect SVD Epidural/IV pain meds prn   Takiera Mayo Bovard-Stuckert 03/23/2014, 9:21 PM

## 2014-03-23 NOTE — Anesthesia Procedure Notes (Signed)
Epidural Patient location during procedure: OB Start time: 03/23/2014 10:54 PM  Staffing Performed by: anesthesiologist   Preanesthetic Checklist Completed: patient identified, site marked, surgical consent, pre-op evaluation, timeout performed, IV checked, risks and benefits discussed and monitors and equipment checked  Epidural Patient position: sitting Prep: site prepped and draped and DuraPrep Patient monitoring: continuous pulse ox and blood pressure Approach: midline Injection technique: LOR air  Needle:  Needle type: Tuohy  Needle gauge: 17 G Needle length: 9 cm and 9 Needle insertion depth: 7 cm Catheter type: closed end flexible Catheter size: 19 Gauge Catheter at skin depth: 12 cm Test dose: negative  Assessment Events: blood not aspirated, injection not painful, no injection resistance, negative IV test and no paresthesia  Additional Notes Discussed risk of headache, infection, bleeding, nerve injury and failed or incomplete block.  Patient voices understanding and wishes to proceed.  Epidural placed easily on first attempt.  No paresthesia.  Patient tolerated procedure well with no apparent complications.  Charlton Haws, MDReason for block:procedure for pain

## 2014-03-24 ENCOUNTER — Encounter (HOSPITAL_COMMUNITY): Payer: Self-pay | Admitting: Obstetrics and Gynecology

## 2014-03-24 LAB — RPR

## 2014-03-24 LAB — TYPE AND SCREEN
ABO/RH(D): A POS
Antibody Screen: NEGATIVE

## 2014-03-24 MED ORDER — LACTATED RINGERS IV SOLN: INTRAVENOUS | Status: DC

## 2014-03-24 MED ORDER — WITCH HAZEL-GLYCERIN EX PADS: 1.0000 "application " | MEDICATED_PAD | CUTANEOUS | Status: DC | PRN

## 2014-03-24 MED ORDER — OXYCODONE-ACETAMINOPHEN 5-325 MG PO TABS
1.0000 | ORAL_TABLET | ORAL | Status: DC | PRN
  Administered 2014-03-24 – 2014-03-26 (×2): 1 via ORAL
  Filled 2014-03-24 (×2): qty 1

## 2014-03-24 MED ORDER — SIMETHICONE 80 MG PO CHEW: 80.0000 mg | CHEWABLE_TABLET | ORAL | Status: DC | PRN

## 2014-03-24 MED ORDER — SENNOSIDES-DOCUSATE SODIUM 8.6-50 MG PO TABS
2.0000 | ORAL_TABLET | ORAL | Status: DC
  Administered 2014-03-25 – 2014-03-26 (×2): 2 via ORAL
  Filled 2014-03-24 (×2): qty 2

## 2014-03-24 MED ORDER — DIBUCAINE 1 % RE OINT: 1.0000 "application " | TOPICAL_OINTMENT | RECTAL | Status: DC | PRN

## 2014-03-24 MED ORDER — PRENATAL MULTIVITAMIN CH
1.0000 | ORAL_TABLET | Freq: Every day | ORAL | Status: DC
  Administered 2014-03-24 – 2014-03-25 (×2): 1 via ORAL
  Filled 2014-03-24 (×2): qty 1

## 2014-03-24 MED ORDER — ONDANSETRON HCL 4 MG PO TABS: 4.0000 mg | ORAL_TABLET | ORAL | Status: DC | PRN

## 2014-03-24 MED ORDER — DIPHENHYDRAMINE HCL 25 MG PO CAPS: 25.0000 mg | ORAL_CAPSULE | Freq: Four times a day (QID) | ORAL | Status: DC | PRN

## 2014-03-24 MED ORDER — ZOLPIDEM TARTRATE 5 MG PO TABS: 5.0000 mg | ORAL_TABLET | Freq: Every evening | ORAL | Status: DC | PRN

## 2014-03-24 MED ORDER — BENZOCAINE-MENTHOL 20-0.5 % EX AERO
1.0000 "application " | INHALATION_SPRAY | CUTANEOUS | Status: DC | PRN
  Administered 2014-03-24: 1 via TOPICAL
  Filled 2014-03-24: qty 56

## 2014-03-24 MED ORDER — ONDANSETRON HCL 4 MG/2ML IJ SOLN: 4.0000 mg | INTRAMUSCULAR | Status: DC | PRN

## 2014-03-24 MED ORDER — IBUPROFEN 600 MG PO TABS
600.0000 mg | ORAL_TABLET | Freq: Four times a day (QID) | ORAL | Status: DC
  Administered 2014-03-24 – 2014-03-26 (×9): 600 mg via ORAL
  Filled 2014-03-24 (×9): qty 1

## 2014-03-24 MED ORDER — TETANUS-DIPHTH-ACELL PERTUSSIS 5-2.5-18.5 LF-MCG/0.5 IM SUSP: 0.5000 mL | Freq: Once | INTRAMUSCULAR | Status: DC

## 2014-03-24 MED ORDER — LANOLIN HYDROUS EX OINT: TOPICAL_OINTMENT | CUTANEOUS | Status: DC | PRN

## 2014-03-24 NOTE — Progress Notes (Signed)
Post Partum Day 0 Subjective: no complaints, voiding, tolerating PO and nl lochia, lochia  Objective: Blood pressure 105/67, pulse 83, temperature 98.1 F (36.7 C), temperature source Oral, resp. rate 18, height 5\' 2"  (1.575 m), weight 89.812 kg (198 lb), SpO2 97.00%, unknown if currently breastfeeding.  Physical Exam:  General: alert and no distress Lochia: appropriate Uterine Fundus: firm  Recent Labs  03/23/14 2210  HGB 14.3  HCT 43.0    Assessment/Plan: Breastfeeding and Lactation consult.  Routine PP care.  D/C tomorrow or Sunday.   LOS: 1 day   Juliannah Ohmann Bovard-Stuckert 03/24/2014, 7:36 AM

## 2014-03-24 NOTE — Progress Notes (Signed)
Delivery of live viable female Jessica Warren) by Dr. Melba Coon at [phone].

## 2014-03-24 NOTE — Progress Notes (Signed)
Patient ID: Jessica Warren, female   DOB: Aug 26, 1991, 23 y.o.   MRN: [id number]   Pt with SROM, C/C/+1 with pressure.  AFVSS 130's category 1 toco irr  Will start pushin anticipate SVD

## 2014-03-24 NOTE — Anesthesia Postprocedure Evaluation (Signed)
  Anesthesia Post-op Note  Patient: Jessica Warren  Procedure(s) Performed: * No procedures listed *  Patient Location: PACU and Mother/Baby  Anesthesia Type:Epidural  Level of Consciousness: awake, alert  and oriented  Airway and Oxygen Therapy: Patient Spontanous Breathing  Post-op Pain: mild  Post-op Assessment: Post-op Vital signs reviewed, Patient's Cardiovascular Status Stable, Respiratory Function Stable, No signs of Nausea or vomiting, Adequate PO intake, Pain level controlled, No headache, No backache, No residual numbness and No residual motor weakness  Post-op Vital Signs: Reviewed and stable  Last Vitals:  Filed Vitals:   03/24/14 1629  BP: 115/73  Pulse: 76  Temp: 36.6 C  Resp: 20    Complications: No apparent anesthesia complications

## 2014-03-25 LAB — CBC
HEMATOCRIT: 37.9 % (ref 36.0–46.0)
HEMOGLOBIN: 12.3 g/dL (ref 12.0–15.0)
MCH: 28.8 pg (ref 26.0–34.0)
MCHC: 32.5 g/dL (ref 30.0–36.0)
MCV: 88.8 fL (ref 78.0–100.0)
Platelets: 199 10*3/uL (ref 150–400)
RBC: 4.27 MIL/uL (ref 3.87–5.11)
RDW: 13.7 % (ref 11.5–15.5)
WBC: 14.3 10*3/uL — AB (ref 4.0–10.5)

## 2014-03-25 NOTE — Discharge Summary (Signed)
Obstetric Discharge Summary Reason for Admission: onset of labor Prenatal Procedures: none Intrapartum Procedures: spontaneous vaginal delivery Postpartum Procedures: none Complications-Operative and Postpartum:  Periurethral laceration Hemoglobin  Date Value Ref Range Status  03/25/2014 12.3  12.0 - 15.0 g/dL Final     HCT  Date Value Ref Range Status  03/25/2014 37.9  36.0 - 46.0 % Final    Physical Exam:  General: alert and cooperative Lochia: appropriate Uterine Fundus: firm   Discharge Diagnoses: Term Pregnancy-delivered  Discharge Information: Date: 03/26/2014 Activity: pelvic rest Diet: routine Medications: Ibuprofen Condition: stable Instructions: refer to practice specific booklet Discharge to: home Follow-up Information   Follow up with Bovard-Stuckert, Jeral Fruit, MD In 6 weeks. (postpartum)    Specialty:  Obstetrics and Gynecology   Contact information:   [address] [address] [phone]       Newborn Data: Live born female  Birth Weight: 6 lb 6.3 oz (2900 g) APGAR: 9, 9  Home with mother.  Jessica Warren 03/26/2014, 8:34 AM

## 2014-03-25 NOTE — Progress Notes (Signed)
Post Partum Day 1 Subjective: no complaints and up ad lib  Objective: Blood pressure 99/58, pulse 66, temperature 97.6 F (36.4 C), temperature source Oral, resp. rate 18, height 5\' 2"  (1.575 m), weight 89.812 kg (198 lb), SpO2 98.00%, unknown if currently breastfeeding.  Physical Exam:  General: alert and cooperative Lochia: appropriate Uterine Fundus: firm    Recent Labs  03/23/14 2210 03/25/14 0640  HGB 14.3 12.3  HCT 43.0 37.9    Assessment/Plan: Plan for discharge tomorrow, baby has to stay for +GBS with less than 4 hours of treatment   LOS: 2 days   Logan Bores 03/25/2014, 9:59 AM

## 2014-03-26 MED ORDER — IBUPROFEN 600 MG PO TABS: 600.0000 mg | ORAL_TABLET | Freq: Four times a day (QID) | ORAL | Status: DC

## 2014-03-26 MED ORDER — HYDROCORTISONE 1 % EX CREA
TOPICAL_CREAM | Freq: Two times a day (BID) | CUTANEOUS | Status: DC
  Administered 2014-03-26: 10:00:00 via TOPICAL
  Filled 2014-03-26: qty 28

## 2014-03-26 MED ORDER — HYDROCORTISONE 1 % EX CREA: TOPICAL_CREAM | Freq: Two times a day (BID) | CUTANEOUS | Status: DC

## 2014-03-26 MED ORDER — OXYCODONE-ACETAMINOPHEN 5-325 MG PO TABS: 1.0000 | ORAL_TABLET | ORAL | Status: DC | PRN

## 2014-03-26 NOTE — Progress Notes (Signed)
Post Partum Day 2 Subjective: no complaints, up ad lib and tolerating PO  Objective: Blood pressure 112/74, pulse 78, temperature 98.7 F (37.1 C), temperature source Oral, resp. rate 18, height 5\' 2"  (1.575 m), weight 89.812 kg (198 lb), SpO2 98.00%, unknown if currently breastfeeding.  Physical Exam:  General: alert and cooperative Lochia: appropriate Uterine Fundus: firm    Recent Labs  03/23/14 2210 03/25/14 0640  HGB 14.3 12.3  HCT 43.0 37.9    Assessment/Plan: Discharge home   LOS: 3 days   Logan Bores 03/26/2014, 8:32 AM

## 2014-04-03 ENCOUNTER — Inpatient Hospital Stay (HOSPITAL_COMMUNITY): Admission: RE | Admit: 2014-04-03 | Payer: BC Managed Care – PPO | Source: Ambulatory Visit

## 2014-08-28 ENCOUNTER — Encounter (HOSPITAL_COMMUNITY): Payer: Self-pay | Admitting: Obstetrics and Gynecology

## 2014-12-11 ENCOUNTER — Other Ambulatory Visit (INDEPENDENT_AMBULATORY_CARE_PROVIDER_SITE_OTHER): Payer: Self-pay | Admitting: General Surgery

## 2014-12-11 NOTE — Progress Notes (Signed)
Jessica Warren 12/11/2014 3:00 PM Location: [facility] Patient #: [id number] DOB: 06-03-1991 Married / Language: English / Race: Asian Female History of Present Illness Jessica Hollingshead MD; 12/11/2014 4:09 PM) Patient words: left breast f/u.  The patient is a 24 year old female    Note:She is referred by Dr. Willis Modena for further evaluation of an enlarging fibroadenoma of the left breast. She has had a left breast mass for years and had a biopsy performed that demonstrated fibroadenoma at the 3 o'clock position anterior depth. She states the area has been getting larger. She had an ultrasound done in March which demonstrated a 4.1 cm fibroadenoma in the left breast anterior depth. There is also a stable benign 2.4 cm oval mass in the left breast at 4 o'clock position posterior depth which is felt to be a fibroadenoma as well. This is not palpable. No family history of breast cancer. She has 2 children. She is here with her husband. She is interested in having the palpable fibroadenoma removed.  Other Problems Davy Pique Bynum, CMA; 12/11/2014 3:00 PM) Back Pain Hemorrhoids Lump In Breast  Past Surgical History Marjean Donna, CMA; 12/11/2014 3:00 PM) No pertinent past surgical history  Diagnostic Studies History Marjean Donna, CMA; 12/11/2014 3:00 PM) Colonoscopy never Mammogram never Pap Smear 1-5 years ago  Allergies Marjean Donna, CMA; 12/11/2014 3:02 PM) Adhesive Tape *MEDICAL DEVICES AND SUPPLIES*  Medication History (Sonya Bynum, CMA; 12/11/2014 3:02 PM) Ortho Tri-Cyclen (28) (0.18/0.215/0.25MG -35 MCG Tablet, Oral) Active.  Social History ([address]; 12/11/2014 3:00 PM) Alcohol use Occasional alcohol use. Caffeine use Tea. No drug use Tobacco use Never smoker.  Pregnancy / Birth History Marjean Donna, [place]; 12/11/2014 3:00 PM) Age at menarche 69 years. Contraceptive History Oral contraceptives. Gravida 3 Maternal age 73-25 Para  2 Regular periods     Review of Systems ([place]; 12/11/2014 3:00 PM) General Not Present- Appetite Loss, Chills, Fatigue, Fever, Night Sweats, Weight Gain and Weight Loss. Skin Not Present- Change in Wart/Mole, Dryness, Hives, Jaundice, New Lesions, Non-Healing Wounds, Rash and Ulcer. HEENT Present- Wears glasses/contact lenses. Not Present- Earache, Hearing Loss, Hoarseness, Nose Bleed, Oral Ulcers, Ringing in the Ears, Seasonal Allergies, Sinus Pain, Sore Throat, Visual Disturbances and Yellow Eyes. Respiratory Not Present- Bloody sputum, Chronic Cough, Difficulty Breathing, Snoring and Wheezing. Breast Present- Breast Mass and Skin Changes. Not Present- Breast Pain and Nipple Discharge. Cardiovascular Not Present- Chest Pain, Difficulty Breathing Lying Down, Leg Cramps, Palpitations, Rapid Heart Rate, Shortness of Breath and Swelling of Extremities. Gastrointestinal Present- Hemorrhoids. Not Present- Abdominal Pain, Bloating, Bloody Stool, Change in Bowel Habits, Chronic diarrhea, Constipation, Difficulty Swallowing, Excessive gas, Gets full quickly at meals, Indigestion, Nausea, Rectal Pain and Vomiting. Female Genitourinary Not Present- Frequency, Nocturia, Painful Urination, Pelvic Pain and Urgency. Musculoskeletal Present- Back Pain. Not Present- Joint Pain, Joint Stiffness, Muscle Pain, Muscle Weakness and Swelling of Extremities. Neurological Not Present- Decreased Memory, Fainting, Headaches, Numbness, Seizures, Tingling, Tremor, Trouble walking and Weakness. Psychiatric Not Present- Anxiety, Bipolar, Change in Sleep Pattern, Depression, Fearful and Frequent crying. Endocrine Not Present- Cold Intolerance, Excessive Hunger, Hair Changes, Heat Intolerance, Hot flashes and New Diabetes. Hematology Not Present- Easy Bruising, Excessive bleeding, Gland problems, HIV and Persistent Infections.  Vitals (Sonya Bynum CMA; 12/11/2014 3:01 PM) 12/11/2014 3:01 PM Weight: 188 lb Height:  63in Body Surface Area: 1.95 m Body Mass Index: 33.3 kg/m Temp.: 98.27F(Temporal)  Pulse: 77 (Regular)  BP: 130/78 (Sitting, Left Arm, Standard)     Physical Exam Sherren Mocha  Adalberto Cole MD; 12/11/2014 4:10 PM)  The physical exam findings are as follows: Note:General: WDWN in NAD. Pleasant and cooperative.  NECK: Supple, no obvious mass or thyroid enlargement.  BREASTS: Left breast is slightly larger than the right breast. Right breast demonstrates no palpable masses or suspicious skin changes. Left breast demonstrates an approximately 4 cm mobile mass at the 3 o'clock position. No palpable mass at the 4 o'clock position.  LYMPHATIC: No palpable cervical, supraclavicular, axillary adenopathy.  NEUROLOGIC: Alert and oriented, answers questions appropriately, normal gait and station.  PSYCHIATRIC: Normal mood, affect , and behavior.    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 The one at the 4 o'clock position is deep and not able to be palpated. She is interested in having the palpable fibroadenoma removed.  Plan: Removal of fibroadenoma of left breast. If I can palpate the one at the 4 o'clock position intraoperatively, we'll remove this as well. We went over the procedure and the risks. The risks include but are not limited to bleeding, infection, wound healing problems, anesthesia. She seems understand and would like to proceed.  Current Plans Free Text Instructions : discussed with patient and provided information. Schedule for Surgery  Jackolyn Confer, MD

## 2014-12-26 DIAGNOSIS — D242 Benign neoplasm of left breast: Secondary | ICD-10-CM

## 2014-12-26 HISTORY — DX: Benign neoplasm of left breast: D24.2

## 2015-01-02 ENCOUNTER — Encounter (HOSPITAL_BASED_OUTPATIENT_CLINIC_OR_DEPARTMENT_OTHER): Payer: Self-pay | Admitting: *Deleted

## 2015-01-08 ENCOUNTER — Encounter (HOSPITAL_BASED_OUTPATIENT_CLINIC_OR_DEPARTMENT_OTHER): Payer: Self-pay | Admitting: Anesthesiology

## 2015-01-08 ENCOUNTER — Ambulatory Visit (HOSPITAL_BASED_OUTPATIENT_CLINIC_OR_DEPARTMENT_OTHER): Payer: BLUE CROSS/BLUE SHIELD | Admitting: Anesthesiology

## 2015-01-08 ENCOUNTER — Ambulatory Visit (HOSPITAL_BASED_OUTPATIENT_CLINIC_OR_DEPARTMENT_OTHER)
Admission: RE | Admit: 2015-01-08 | Discharge: 2015-01-08 | Disposition: A | Discharge status: Home / Self Care | Payer: BLUE CROSS/BLUE SHIELD | Source: Ambulatory Visit | Attending: General Surgery | Admitting: General Surgery

## 2015-01-08 ENCOUNTER — Encounter (HOSPITAL_BASED_OUTPATIENT_CLINIC_OR_DEPARTMENT_OTHER)
Admission: RE | Disposition: A | Discharge status: Home / Self Care | Payer: Self-pay | Source: Ambulatory Visit | Attending: General Surgery

## 2015-01-08 DIAGNOSIS — Z91048 Other nonmedicinal substance allergy status: Secondary | ICD-10-CM | POA: Diagnosis not present

## 2015-01-08 DIAGNOSIS — D242 Benign neoplasm of left breast: Secondary | ICD-10-CM | POA: Diagnosis present

## 2015-01-08 HISTORY — DX: Benign neoplasm of left breast: D24.2

## 2015-01-08 HISTORY — PX: BREAST LUMPECTOMY: SHX2

## 2015-01-08 SURGERY — BREAST LUMPECTOMY
Anesthesia: General | Site: Breast | Laterality: Left

## 2015-01-08 MED ORDER — ONDANSETRON HCL 4 MG/2ML IJ SOLN
INTRAMUSCULAR | Status: DC | PRN
  Administered 2015-01-08: 4 mg via INTRAVENOUS

## 2015-01-08 MED ORDER — DEXAMETHASONE SODIUM PHOSPHATE 4 MG/ML IJ SOLN
INTRAMUSCULAR | Status: DC | PRN
  Administered 2015-01-08: 10 mg via INTRAVENOUS

## 2015-01-08 MED ORDER — LIDOCAINE HCL (CARDIAC) 20 MG/ML IV SOLN
INTRAVENOUS | Status: DC | PRN
  Administered 2015-01-08: 50 mg via INTRAVENOUS

## 2015-01-08 MED ORDER — LACTATED RINGERS IV SOLN
INTRAVENOUS | Status: DC
  Administered 2015-01-08 (×2): via INTRAVENOUS

## 2015-01-08 MED ORDER — SODIUM CHLORIDE 0.9 % IJ SOLN: 3.0000 mL | INTRAMUSCULAR | Status: DC | PRN

## 2015-01-08 MED ORDER — FENTANYL CITRATE 0.05 MG/ML IJ SOLN
INTRAMUSCULAR | Status: DC | PRN
  Administered 2015-01-08: 100 ug via INTRAVENOUS
  Administered 2015-01-08 (×2): 50 ug via INTRAVENOUS

## 2015-01-08 MED ORDER — BUPIVACAINE HCL (PF) 0.5 % IJ SOLN
INTRAMUSCULAR | Status: AC
  Filled 2015-01-08: qty 30

## 2015-01-08 MED ORDER — MIDAZOLAM HCL 5 MG/5ML IJ SOLN
INTRAMUSCULAR | Status: DC | PRN
  Administered 2015-01-08: 2 mg via INTRAVENOUS

## 2015-01-08 MED ORDER — ACETAMINOPHEN 650 MG RE SUPP: 650.0000 mg | RECTAL | Status: DC | PRN

## 2015-01-08 MED ORDER — ACETAMINOPHEN 325 MG PO TABS: 650.0000 mg | ORAL_TABLET | ORAL | Status: DC | PRN

## 2015-01-08 MED ORDER — FENTANYL CITRATE 0.05 MG/ML IJ SOLN: 50.0000 ug | INTRAMUSCULAR | Status: DC | PRN

## 2015-01-08 MED ORDER — ONDANSETRON HCL 4 MG/2ML IJ SOLN: 4.0000 mg | Freq: Four times a day (QID) | INTRAMUSCULAR | Status: DC | PRN

## 2015-01-08 MED ORDER — FENTANYL CITRATE 0.05 MG/ML IJ SOLN: 25.0000 ug | INTRAMUSCULAR | Status: DC | PRN

## 2015-01-08 MED ORDER — FENTANYL CITRATE 0.05 MG/ML IJ SOLN
INTRAMUSCULAR | Status: AC
  Filled 2015-01-08: qty 4

## 2015-01-08 MED ORDER — OXYCODONE HCL 5 MG PO TABS: 5.0000 mg | ORAL_TABLET | Freq: Once | ORAL | Status: DC | PRN

## 2015-01-08 MED ORDER — OXYCODONE HCL 5 MG/5ML PO SOLN: 5.0000 mg | Freq: Once | ORAL | Status: DC | PRN

## 2015-01-08 MED ORDER — HYDROCODONE-ACETAMINOPHEN 5-325 MG PO TABS: 1.0000 | ORAL_TABLET | ORAL | Status: DC | PRN

## 2015-01-08 MED ORDER — MIDAZOLAM HCL 2 MG/2ML IJ SOLN
INTRAMUSCULAR | Status: AC
  Filled 2015-01-08: qty 2

## 2015-01-08 MED ORDER — CEFAZOLIN SODIUM-DEXTROSE 2-3 GM-% IV SOLR
INTRAVENOUS | Status: AC
  Filled 2015-01-08: qty 50

## 2015-01-08 MED ORDER — OXYCODONE HCL 5 MG PO TABS: 5.0000 mg | ORAL_TABLET | ORAL | Status: DC | PRN

## 2015-01-08 MED ORDER — BUPIVACAINE HCL (PF) 0.5 % IJ SOLN
INTRAMUSCULAR | Status: DC | PRN
  Administered 2015-01-08: 15 mL

## 2015-01-08 MED ORDER — MIDAZOLAM HCL 2 MG/ML PO SYRP: 12.0000 mg | ORAL_SOLUTION | Freq: Once | ORAL | Status: DC | PRN

## 2015-01-08 MED ORDER — MIDAZOLAM HCL 2 MG/2ML IJ SOLN: 1.0000 mg | INTRAMUSCULAR | Status: DC | PRN

## 2015-01-08 MED ORDER — CEFAZOLIN SODIUM-DEXTROSE 2-3 GM-% IV SOLR
2.0000 g | INTRAVENOUS | Status: AC
  Administered 2015-01-08: 2 g via INTRAVENOUS

## 2015-01-08 MED ORDER — MORPHINE SULFATE 2 MG/ML IJ SOLN
2.0000 mg | INTRAMUSCULAR | Status: DC | PRN
Start: 2015-01-08 — End: 2015-01-08

## 2015-01-08 MED ORDER — PROPOFOL 10 MG/ML IV BOLUS
INTRAVENOUS | Status: DC | PRN
  Administered 2015-01-08: 200 mg via INTRAVENOUS

## 2015-01-08 SURGICAL SUPPLY — 40 items
BENZOIN TINCTURE PRP APPL 2/3 (GAUZE/BANDAGES/DRESSINGS) ×2 IMPLANT
BINDER BREAST LRG (GAUZE/BANDAGES/DRESSINGS) ×2 IMPLANT
BLADE SURG 15 STRL LF DISP TIS (BLADE) ×1 IMPLANT
BLADE SURG 15 STRL SS (BLADE) ×1
CANISTER SUCT 1200ML W/VALVE (MISCELLANEOUS) ×2 IMPLANT
CHLORAPREP W/TINT 26ML (MISCELLANEOUS) ×2 IMPLANT
COVER BACK TABLE 60X90IN (DRAPES) ×2 IMPLANT
COVER MAYO STAND STRL (DRAPES) ×2 IMPLANT
DECANTER SPIKE VIAL GLASS SM (MISCELLANEOUS) IMPLANT
DEVICE DUBIN W/COMP PLATE 8390 (MISCELLANEOUS) IMPLANT
DRAPE LAPAROTOMY 100X72 PEDS (DRAPES) ×2 IMPLANT
DRAPE UTILITY XL STRL (DRAPES) ×2 IMPLANT
DRSG TELFA 3X8 NADH (GAUZE/BANDAGES/DRESSINGS) ×2 IMPLANT
ELECT COATED BLADE 2.86 ST (ELECTRODE) ×2 IMPLANT
ELECT REM PT RETURN 9FT ADLT (ELECTROSURGICAL) ×2
ELECTRODE REM PT RTRN 9FT ADLT (ELECTROSURGICAL) ×1 IMPLANT
GAUZE SPONGE 4X4 12PLY STRL (GAUZE/BANDAGES/DRESSINGS) ×2 IMPLANT
GLOVE BIOGEL PI IND STRL 8.5 (GLOVE) ×1 IMPLANT
GLOVE BIOGEL PI INDICATOR 8.5 (GLOVE) ×1
GLOVE ECLIPSE 8.0 STRL XLNG CF (GLOVE) ×2 IMPLANT
GLOVE EXAM NITRILE EXT CUFF MD (GLOVE) ×2 IMPLANT
GLOVE SURG SS PI 7.0 STRL IVOR (GLOVE) ×2 IMPLANT
GOWN STRL REUS W/ TWL LRG LVL3 (GOWN DISPOSABLE) ×2 IMPLANT
GOWN STRL REUS W/TWL LRG LVL3 (GOWN DISPOSABLE) ×2
LIQUID BAND (GAUZE/BANDAGES/DRESSINGS) ×2 IMPLANT
NEEDLE HYPO 25X1 1.5 SAFETY (NEEDLE) ×2 IMPLANT
NS IRRIG 1000ML POUR BTL (IV SOLUTION) ×2 IMPLANT
PACK BASIN DAY SURGERY FS (CUSTOM PROCEDURE TRAY) ×2 IMPLANT
PENCIL BUTTON HOLSTER BLD 10FT (ELECTRODE) ×2 IMPLANT
SLEEVE SCD COMPRESS KNEE MED (MISCELLANEOUS) ×2 IMPLANT
SPONGE GAUZE 4X4 12PLY STER LF (GAUZE/BANDAGES/DRESSINGS) IMPLANT
STRIP CLOSURE SKIN 1/2X4 (GAUZE/BANDAGES/DRESSINGS) IMPLANT
SUT MON AB 4-0 PC3 18 (SUTURE) ×2 IMPLANT
SUT SILK 2 0 FS (SUTURE) IMPLANT
SUT VICRYL 3-0 CR8 SH (SUTURE) ×2 IMPLANT
SYR CONTROL 10ML LL (SYRINGE) ×2 IMPLANT
TOWEL OR 17X24 6PK STRL BLUE (TOWEL DISPOSABLE) ×2 IMPLANT
TOWEL OR NON WOVEN STRL DISP B (DISPOSABLE) IMPLANT
TUBE CONNECTING 20X1/4 (TUBING) ×2 IMPLANT
YANKAUER SUCT BULB TIP NO VENT (SUCTIONS) ×2 IMPLANT

## 2015-01-08 NOTE — Op Note (Signed)
Operative Note  Jessica Warren female 24 y.o. 01/08/2015  PREOPERATIVE DX:  Left breast fibroadenoma  POSTOPERATIVE DX:  Same  PROCEDURE:   Excision of left breast fibroadenoma         Surgeon: Odis Hollingshead   Assistants: none  Anesthesia: General LMA anesthesia  Indications:   This is a 24 year old female with a biopsy-proven fibroadenoma in the left breast. The fibroadenoma has been slowly enlarging and is causing breast asymmetry. There is also a reported 2.4 cm mass at the 4:00 position posterior depth of the left breast but it is not palpable. She now presents for excision of the enlarging left breast fibroadenoma.    Procedure Detail:  She was seen in the holding room. The left breast was marked with my initials. She was brought to the operating room placed supine on the operating table in the general anesthetic was given. The left breast was sterilely prepped and draped. A timeout was performed.  The mass in the left breast was easily palpable in the lateral aspect. Local anesthetic consisting of Marcaine was infiltrated in the circumareolar area. I made a lateral circumareolar incision dividing the skin and subcutaneous tissue. I then manipulated the fibroadenoma up close to the bone and used electrocautery I excised it. It was sent to pathology. I tried to palpate another discrete mass in the 4:00 position and did some dissection here but just ran into dense fatty breast tissue. There is no discrete mass noted at the 4:00 position posterior depth.  I irrigated the wound. Hemostasis was adequate. The deep aspect of the wound was anesthetized with a Marcaine solution. The subcutaneous tissue was approximated with interrupted 3-0 Vicryl sutures. The skin was closed with a running 4-0 Monocryl subcuticular stitch. Dermabond was applied to the skin followed by a nonstick gauze and a bulky gauze dressing. A breast binder was applied.  She tolerated the procedure well without any  apparent complications and was taken to the recovery room in satisfactory condition.        Complications:  * No complications entered in OR log *         Disposition: PACU - hemodynamically stable.         Condition: stable

## 2015-01-08 NOTE — Transfer of Care (Signed)
Immediate Anesthesia Transfer of Care Note  Patient: Jessica Warren  Procedure(s) Performed: Procedure(s): REMOVAL OF FIBROADENOMA LEFT BREAST (Left)  Patient Location: PACU  Anesthesia Type:General  Level of Consciousness: awake and alert   Airway & Oxygen Therapy: Patient Spontanous Breathing and Patient connected to face mask oxygen  Post-op Assessment: Report given to RN and Post -op Vital signs reviewed and stable  Post vital signs: Reviewed and stable  Last Vitals:  Filed Vitals:   01/08/15 1247  BP: 120/75  Pulse: 63  Temp: 36.8 C  Resp: 16    Complications: No apparent anesthesia complications

## 2015-01-08 NOTE — Anesthesia Procedure Notes (Signed)
Procedure Name: LMA Insertion Date/Time: 01/08/2015 1:44 PM Performed by: Lieutenant Diego Pre-anesthesia Checklist: Patient identified, Emergency Drugs available, Suction available and Patient being monitored Patient Re-evaluated:Patient Re-evaluated prior to inductionOxygen Delivery Method: Circle System Utilized Preoxygenation: Pre-oxygenation with 100% oxygen Intubation Type: IV induction Ventilation: Mask ventilation without difficulty LMA: LMA inserted LMA Size: 4.0 Number of attempts: 1 Airway Equipment and Method: Bite block Placement Confirmation: positive ETCO2 and breath sounds checked- equal and bilateral Tube secured with: Tape Dental Injury: Teeth and Oropharynx as per pre-operative assessment

## 2015-01-08 NOTE — Interval H&P Note (Signed)
History and Physical Interval Note:  01/08/2015 1:31 PM  Jessica Warren  has presented today for surgery, with the diagnosis of left breast fibroadenoma  The various methods of treatment have been discussed with the patient and family. After consideration of risks, benefits and other options for treatment, the patient has consented to  Procedure(s): REMOVAL OF FIBROADENOMA LEFT BREAST (Left) as a surgical intervention .  The patient's history has been reviewed, patient examined, no change in status, stable for surgery.  I have reviewed the patient's chart and labs.  Questions were answered to the patient's satisfaction.     Dayne Dekay Lenna Sciara

## 2015-01-08 NOTE — Anesthesia Preprocedure Evaluation (Signed)
Anesthesia Evaluation  Patient identified by MRN, date of birth, ID band Patient awake    Reviewed: Allergy & Precautions, NPO status , Patient's Chart, lab work & pertinent test results  Airway Mallampati: II   Neck ROM: full    Dental   Pulmonary neg pulmonary ROS,          Cardiovascular negative cardio ROS      Neuro/Psych    GI/Hepatic   Endo/Other  obese  Renal/GU      Musculoskeletal   Abdominal   Peds  Hematology   Anesthesia Other Findings   Reproductive/Obstetrics                             Anesthesia Physical Anesthesia Plan  ASA: I  Anesthesia Plan: General   Post-op Pain Management:    Induction: Intravenous  Airway Management Planned: LMA  Additional Equipment:   Intra-op Plan:   Post-operative Plan:   Informed Consent: I have reviewed the patients History and Physical, chart, labs and discussed the procedure including the risks, benefits and alternatives for the proposed anesthesia with the patient or authorized representative who has indicated his/her understanding and acceptance.     Plan Discussed with: CRNA, Anesthesiologist and Surgeon  Anesthesia Plan Comments:         Anesthesia Quick Evaluation

## 2015-01-08 NOTE — Anesthesia Postprocedure Evaluation (Signed)
Anesthesia Post Note  Patient: Jessica Warren  Procedure(s) Performed: Procedure(s) (LRB): REMOVAL OF FIBROADENOMA LEFT BREAST (Left)  Anesthesia type: General  Patient location: PACU  Post pain: Pain level controlled and Adequate analgesia  Post assessment: Post-op Vital signs reviewed, Patient's Cardiovascular Status Stable, Respiratory Function Stable, Patent Airway and Pain level controlled  Last Vitals:  Filed Vitals:   01/08/15 1527  BP: 121/73  Pulse: 52  Temp: 36.6 C  Resp: 18    Post vital signs: Reviewed and stable  Level of consciousness: awake, alert  and oriented  Complications: No apparent anesthesia complications

## 2015-01-08 NOTE — Discharge Instructions (Addendum)
[place] Office Phone Number [phone]  BREAST BIOPSY/ PARTIAL MASTECTOMY: POST OP INSTRUCTIONS  Always review your discharge instruction sheet given to you by the facility where your surgery was performed.  IF YOU HAVE DISABILITY OR FAMILY LEAVE FORMS, YOU MUST BRING THEM TO THE OFFICE FOR PROCESSING.  DO NOT GIVE THEM TO YOUR DOCTOR.  1. A prescription for pain medication may be given to you upon discharge.  Take your pain medication as prescribed, if needed.  If narcotic pain medicine is not needed, then you may take acetaminophen (Tylenol) or ibuprofen (Advil) as needed. 2. Take your usually prescribed medications unless otherwise directed 3. If you need a refill on your pain medication, please contact your pharmacy.  They will contact our office to request authorization.  Prescriptions will not be filled after 5pm or on week-ends. 4. You should eat very light the first 24 hours after surgery, such as soup, crackers, pudding, etc.  Resume your normal diet the day after surgery. 5. Most patients will experience some swelling and bruising in the breast.  Ice packs and a good support bra will help.  Swelling and bruising can take several days to resolve.  6. It is common to experience some constipation if taking pain medication after surgery.  Increasing fluid intake and taking a stool softener will usually help or prevent this problem from occurring.  A mild laxative (Milk of Magnesia or Miralax) should be taken according to package directions if there are no bowel movements after 48 hours. 7. Unless discharge instructions indicate otherwise, you may remove your bandages 48 hours after surgery, and you may shower at that time.  You may have steri-strips (small skin tapes) in place directly over the incision.  These strips should be left on the skin for 7-10 days.  If your surgeon used skin glue on the incision, you may shower in 24 hours.  The glue will flake off over the next  2-3 weeks.  Any sutures or staples will be removed at the office during your follow-up visit. 8. ACTIVITIES:  Light daily activities for 2-3 days.  After 2-3 days begin to resume normal activities as long as you are pain-free.  Wearing a good support bra or sports bra minimizes pain and swelling.  You may have sexual intercourse when it is comfortable. a. You may drive when you no longer are taking prescription pain medication, you can comfortably wear a seatbelt, and you can safely maneuver your car and apply brakes. b. RETURN TO WORK:  2-3 days when comfortable.______________________________________________________________________________________ 9. You should see your doctor in the office for a follow-up appointment approximately two weeks after your surgery.   Please call and make the appointment. 10. OTHER INSTRUCTIONS: _______________________________________________________________________________________________ _____________________________________________________________________________________________________________________________________ _____________________________________________________________________________________________________________________________________ _____________________________________________________________________________________________________________________________________  WHEN TO CALL YOUR DOCTOR: 1. Fever over 101.0 2. Nausea and/or vomiting. 3. Extreme swelling or bruising. 4. Continued bleeding from incision. 5. Increased pain, redness, or drainage from the incision.  The clinic staff is available to answer your questions during regular business hours.  Please dont hesitate to call and ask to speak to one of the nurses for clinical concerns.  If you have a medical emergency, go to the nearest emergency room or call 911.  A surgeon from [facility] is always on call at the hospital.  For further questions, please visit  [email or web address]      Post Anesthesia Home Care Instructions  Activity: Get plenty of rest for the remainder of the day. A responsible adult should  stay with you for 24 hours following the procedure.  For the next 24 hours, DO NOT: -Drive a car -Paediatric nurse -Drink alcoholic beverages -Take any medication unless instructed by your physician -Make any legal decisions or sign important papers.  Meals: Start with liquid foods such as gelatin or soup. Progress to regular foods as tolerated. Avoid greasy, spicy, heavy foods. If nausea and/or vomiting occur, drink only clear liquids until the nausea and/or vomiting subsides. Call your physician if vomiting continues.  Special Instructions/Symptoms: Your throat may feel dry or sore from the anesthesia or the breathing tube placed in your throat during surgery. If this causes discomfort, gargle with warm salt water. The discomfort should disappear within 24 hours.

## 2015-01-08 NOTE — H&P (Signed)
H & P  HPI:She was referred by Dr. Willis Modena for further evaluation of an enlarging fibroadenoma of the left breast. She has had a left breast mass for years and had a biopsy performed that demonstrated fibroadenoma at the 3 o'clock position anterior depth. She states the area has been getting larger. She had an ultrasound done in March which demonstrated a 4.1 cm fibroadenoma in the left breast anterior depth. There is also a stable benign 2.4 cm oval mass in the left breast at 4 o'clock position posterior depth which is felt to be a fibroadenoma as well. This is not palpable. No family history of breast cancer. She has 2 children. She is here with her husband. She is interested in having the palpable fibroadenoma removed.   Other Problems Back Pain Hemorrhoids Lump In Breast  Past Surgical History No pertinent past surgical history  Diagnostic Studies History  Colonoscopy never Mammogram never Pap Smear 1-5 years ago  Allergies  Adhesive Tape *MEDICAL DEVICES AND SUPPLIES*  Prior to Admission medications   Medication Sig Start Date End Date Taking? Authorizing Provider  fluconazole (DIFLUCAN) 200 MG tablet Take 500 mg by mouth once a week.   Yes Historical Provider, MD  Norgestimate-Ethinyl Estradiol Triphasic 0.18/0.215/0.25 MG-35 MCG tablet Take 1 tablet by mouth daily.   Yes Historical Provider, MD     Social History  Alcohol use Occasional alcohol use. Caffeine use Tea. No drug use Tobacco use Never smoker.  Pregnancy / Birth History  Age at menarche 33 years. Contraceptive History Oral contraceptives. Gravida 3 Maternal age 15-25 Para 2 Regular periods  Review of Systems  General Not Present- Appetite Loss, Chills, Fatigue, Fever, Night Sweats, Weight Gain and Weight Loss. Skin Not Present- Change in Wart/Mole, Dryness, Hives, Jaundice, New Lesions, Non-Healing Wounds, Rash and Ulcer. HEENT Present- Wears glasses/contact lenses. Not  Present- Earache, Hearing Loss, Hoarseness, Nose Bleed, Oral Ulcers, Ringing in the Ears, Seasonal Allergies, Sinus Pain, Sore Throat, Visual Disturbances and Yellow Eyes. Respiratory Not Present- Bloody sputum, Chronic Cough, Difficulty Breathing, Snoring and Wheezing. Breast Present- Breast Mass and Skin Changes. Not Present- Breast Pain and Nipple Discharge. Cardiovascular Not Present- Chest Pain, Difficulty Breathing Lying Down, Leg Cramps, Palpitations, Rapid Heart Rate, Shortness of Breath and Swelling of Extremities. Gastrointestinal Present- Hemorrhoids. Not Present- Abdominal Pain, Bloating, Bloody Stool, Change in Bowel Habits, Chronic diarrhea, Constipation, Difficulty Swallowing, Excessive gas, Gets full quickly at meals, Indigestion, Nausea, Rectal Pain and Vomiting. Female Genitourinary Not Present- Frequency, Nocturia, Painful Urination, Pelvic Pain and Urgency. Musculoskeletal Present- Back Pain. Not Present- Joint Pain, Joint Stiffness, Muscle Pain, Muscle Weakness and Swelling of Extremities. Neurological Not Present- Decreased Memory, Fainting, Headaches, Numbness, Seizures, Tingling, Tremor, Trouble walking and Weakness. Psychiatric Not Present- Anxiety, Bipolar, Change in Sleep Pattern, Depression, Fearful and Frequent crying. Endocrine Not Present- Cold Intolerance, Excessive Hunger, Hair Changes, Heat Intolerance, Hot flashes and New Diabetes. Hematology Not Present- Easy Bruising, Excessive bleeding, Gland problems, HIV and Persistent Infections.   Physical Exam  The physical exam findings are as follows: Note:General: WDWN in NAD. Pleasant and cooperative.  NECK: Supple, no obvious mass or thyroid enlargement.  BREASTS: Left breast is slightly larger than the right breast. Right breast demonstrates no palpable masses or suspicious skin changes. Left breast demonstrates an approximately 4 cm mobile mass at the 3 o'clock position. No palpable mass at the 4 o'clock  position.  LYMPHATIC: No palpable cervical, supraclavicular, axillary adenopathy.  NEUROLOGIC: Alert and oriented, answers questions appropriately, normal gait and  station.  PSYCHIATRIC: Normal mood, affect , and behavior.    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 The one at the 4 o'clock position is deep and not able to be palpated. She is interested in having the palpable fibroadenoma removed.  Plan: Removal of fibroadenoma of left breast. If I can palpate the one at the 4 o'clock position intraoperatively, we'll remove this as well. We went over the procedure and the risks. The risks include but are not limited to bleeding, infection, wound healing problems, anesthesia. She seems understand and would like to proceed  Jackolyn Confer, MD

## 2015-01-09 ENCOUNTER — Encounter (HOSPITAL_BASED_OUTPATIENT_CLINIC_OR_DEPARTMENT_OTHER): Payer: Self-pay | Admitting: General Surgery

## 2015-06-11 ENCOUNTER — Ambulatory Visit (INDEPENDENT_AMBULATORY_CARE_PROVIDER_SITE_OTHER): Payer: BLUE CROSS/BLUE SHIELD | Admitting: Family Medicine

## 2015-06-11 VITALS — BP 118/76 | HR 90 | Temp 98.6°F | Resp 16 | Ht 62.0 in | Wt 179.4 lb

## 2015-06-11 DIAGNOSIS — J029 Acute pharyngitis, unspecified: Secondary | ICD-10-CM

## 2015-06-11 DIAGNOSIS — H109 Unspecified conjunctivitis: Secondary | ICD-10-CM | POA: Diagnosis not present

## 2015-06-11 DIAGNOSIS — R05 Cough: Secondary | ICD-10-CM

## 2015-06-11 DIAGNOSIS — R059 Cough, unspecified: Secondary | ICD-10-CM

## 2015-06-11 DIAGNOSIS — J069 Acute upper respiratory infection, unspecified: Secondary | ICD-10-CM

## 2015-06-11 LAB — POCT RAPID STREP A (OFFICE): Rapid Strep A Screen: NEGATIVE

## 2015-06-11 MED ORDER — NEOMYCIN-POLYMYXIN-HC 3.5-10000-1 OP SUSP: 3.0000 [drp] | Freq: Four times a day (QID) | OPHTHALMIC | Status: DC

## 2015-06-11 MED ORDER — HYDROCODONE-HOMATROPINE 5-1.5 MG/5ML PO SYRP: 5.0000 mL | ORAL_SOLUTION | Freq: Every evening | ORAL | Status: DC | PRN

## 2015-06-11 MED ORDER — AZITHROMYCIN 250 MG PO TABS: ORAL_TABLET | ORAL | Status: DC

## 2015-06-11 NOTE — Progress Notes (Signed)
Chief Complaint:  Chief Complaint  Patient presents with  . Cough    x 1 week   . Eye Problem    rt. x this morning   . Nasal Congestion    x 1 week    HPI: Jessica Warren is a 24 y.o. female who reports to [facility] today complaining of URI sxs , nonproductive cough, sick sister in law, has a toddler in daycare, she gets yeast infections with abx so prefer short course. She has  had a sore throat, minimal sinus pressure. No fevers or chills. Denies n/v/abd pain, diarrhea, rashes, ear pain   Past Medical History  Diagnosis Date  . 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 No past surgeries    . Breast lumpectomy Left 01/08/2015    Procedure: REMOVAL OF FIBROADENOMA LEFT BREAST;  Surgeon: Jessica Confer, MD;  Location: [place];  Service: General;  Laterality: Left;   Social History   Social History  . Marital Status: Single    Spouse Name: N/A  . Number of Children: N/A  . Years of Education: N/A   Social History Main Topics  . Smoking status: Never Smoker   . Smokeless tobacco: Never Used  . Alcohol Use: No     Warren: occasionally  . Drug Use: No  . Sexual Activity: Yes    Birth Control/ Protection: None   Other Topics Concern  . None   Social History Narrative   Family History  Problem Relation Age of Onset  . Hyperlipidemia Mother   . Stroke Father   . Stroke Brother    Allergies  Allergen Reactions  . Adhesive [Tape] Hives  . Cherry Hives   Prior to Admission medications   Medication Sig Start Date End Date Taking? Authorizing Provider  fluconazole (DIFLUCAN) 200 MG tablet Take 500 mg by mouth once a week.   Yes Historical Provider, MD  Norgestimate-Ethinyl Estradiol Triphasic 0.18/0.215/0.25 MG-35 MCG tablet Take 1 tablet by mouth daily.   Yes Historical Provider, MD  HYDROcodone-acetaminophen (NORCO/VICODIN) 5-325 MG per tablet Take 1-2 tablets by mouth every 4 (four) hours as  needed for moderate pain or severe pain. Patient not taking: Reported on 06/11/2015 01/08/15   Jessica Confer, MD     ROS: The patient denies fevers, chills, night sweats, unintentional weight loss, chest pain, palpitations, wheezing, dyspnea on exertion, nausea, vomiting, abdominal pain, dysuria, hematuria, melena, numbness, weakness, or tingling.   All other systems have been reviewed and were otherwise negative with the exception of those mentioned in the HPI and as above.    PHYSICAL EXAM: Filed Vitals:   06/11/15 1225  BP: 118/76  Pulse: 90  Temp: 98.6 F (37 C)  Resp: 16   Body mass index is 32.8 kg/(m^2).   General: Alert, no acute distress HEENT:  Normocephalic, atraumatic, oropharynx patent. EOMI, PERRLA + right eye conjucntivitis, + erythematous sore throat. +enlarged tonsils.  Cardiovascular:  Regular rate and rhythm, no rubs murmurs or gallops.  No Carotid bruits, radial pulse intact. No pedal edema.  Respiratory: Clear to auscultation bilaterally.  No wheezes, rales, or rhonchi.  No cyanosis, no use of accessory musculature Abdominal: No organomegaly, abdomen is soft and non-tender, positive bowel sounds. No masses. Skin: No rashes. Neurologic: Facial musculature symmetric. Psychiatric: Patient acts appropriately throughout our interaction. Lymphatic: No cervical lymphadenopathy Musculoskeletal: Gait intact. No edema, tenderness  LABS: Results for orders placed or performed  in visit on 06/11/15  POCT rapid strep A  Result Value Ref Range   Rapid Strep A Screen Negative Negative     EKG/XRAY:   Primary read interpreted by Dr. Marin Warren at [facility].   ASSESSMENT/PLAN: Encounter Diagnoses  Name Primary?  . Acute pharyngitis, unspecified pharyngitis type Yes  . Conjunctivitis of right eye   . Cough   . Acute upper respiratory infection    Strep cx pending Rx azithromycin, neomycin optho drops  Rx hycodan, otc lozenges with lidocaine Fu prn   Gross sideeffects,  risk and benefits, and alternatives of medications d/w patient. Patient is aware that all medications have potential sideeffects and we are unable to predict every sideeffect or drug-drug interaction that may occur.  Jessica Higginson DO  06/11/2015 2:35 PM  06/13/15-spoke to patietn about negative throat culture

## 2015-06-13 LAB — CULTURE, GROUP A STREP: Organism ID, Bacteria: NORMAL

## 2017-04-16 LAB — OB RESULTS CONSOLE ABO/RH: RH Type: POSITIVE

## 2017-04-16 LAB — OB RESULTS CONSOLE RPR: RPR: NONREACTIVE

## 2017-04-16 LAB — OB RESULTS CONSOLE GC/CHLAMYDIA
CHLAMYDIA, DNA PROBE: NEGATIVE
GC PROBE AMP, GENITAL: NEGATIVE

## 2017-04-16 LAB — OB RESULTS CONSOLE HEPATITIS B SURFACE ANTIGEN: HEP B S AG: NEGATIVE

## 2017-04-16 LAB — OB RESULTS CONSOLE RUBELLA ANTIBODY, IGM: Rubella: IMMUNE

## 2017-04-16 LAB — OB RESULTS CONSOLE HIV ANTIBODY (ROUTINE TESTING): HIV: NONREACTIVE

## 2017-04-16 LAB — OB RESULTS CONSOLE ANTIBODY SCREEN: ANTIBODY SCREEN: NEGATIVE

## 2017-10-26 ENCOUNTER — Inpatient Hospital Stay (HOSPITAL_COMMUNITY)
Admission: AD | Admit: 2017-10-26 | Payer: BLUE CROSS/BLUE SHIELD | Source: Ambulatory Visit | Admitting: Obstetrics and Gynecology

## 2017-10-27 NOTE — L&D Delivery Note (Signed)
Delivery Note Patient progressed to complete and pushed well.  At 2:59 PM a viable female was delivered via Vaginal, Spontaneous (Presentation: vtx; LOA ).  APGAR: 9, 9; weight  pending.   Placenta status: spontaneous, intact.  Cord:  with the following complications: none.  Anesthesia:  Epidural Episiotomy: None Lacerations:  Small periurethral Suture Repair: 3.0 vicryl Est. Blood Loss (mL):  200  Mom to postpartum.  Baby to Couplet care / Skin to Skin.  Jessica Warren 11/23/2017, 3:16 PM

## 2017-11-12 ENCOUNTER — Telehealth (HOSPITAL_COMMUNITY): Payer: Self-pay | Admitting: *Deleted

## 2017-11-12 ENCOUNTER — Encounter (HOSPITAL_COMMUNITY): Payer: Self-pay | Admitting: *Deleted

## 2017-11-12 NOTE — Telephone Encounter (Signed)
Preadmission screen  

## 2017-11-23 ENCOUNTER — Inpatient Hospital Stay (HOSPITAL_COMMUNITY): Payer: Medicaid Other | Admitting: Anesthesiology

## 2017-11-23 ENCOUNTER — Inpatient Hospital Stay (HOSPITAL_COMMUNITY)
Admission: RE | Admit: 2017-11-23 | Discharge: 2017-11-24 | DRG: 807 | Disposition: A | Discharge status: Home / Self Care | Payer: Medicaid Other | Source: Ambulatory Visit | Attending: Obstetrics and Gynecology | Admitting: Obstetrics and Gynecology

## 2017-11-23 ENCOUNTER — Encounter (HOSPITAL_COMMUNITY): Payer: Self-pay

## 2017-11-23 DIAGNOSIS — Z3483 Encounter for supervision of other normal pregnancy, third trimester: Secondary | ICD-10-CM | POA: Diagnosis present

## 2017-11-23 DIAGNOSIS — D649 Anemia, unspecified: Secondary | ICD-10-CM | POA: Diagnosis present

## 2017-11-23 DIAGNOSIS — O9902 Anemia complicating childbirth: Secondary | ICD-10-CM | POA: Diagnosis present

## 2017-11-23 DIAGNOSIS — Z3A39 39 weeks gestation of pregnancy: Secondary | ICD-10-CM

## 2017-11-23 LAB — TYPE AND SCREEN
ABO/RH(D): A POS
ANTIBODY SCREEN: NEGATIVE

## 2017-11-23 LAB — CBC
HEMATOCRIT: 35.4 % — AB (ref 36.0–46.0)
HEMOGLOBIN: 11.9 g/dL — AB (ref 12.0–15.0)
MCH: 28.4 pg (ref 26.0–34.0)
MCHC: 33.6 g/dL (ref 30.0–36.0)
MCV: 84.5 fL (ref 78.0–100.0)
Platelets: 272 10*3/uL (ref 150–400)
RBC: 4.19 MIL/uL (ref 3.87–5.11)
RDW: 13.4 % (ref 11.5–15.5)
WBC: 13.2 10*3/uL — ABNORMAL HIGH (ref 4.0–10.5)

## 2017-11-23 LAB — RPR: RPR: NONREACTIVE

## 2017-11-23 MED ORDER — WITCH HAZEL-GLYCERIN EX PADS: 1.0000 "application " | MEDICATED_PAD | CUTANEOUS | Status: DC | PRN

## 2017-11-23 MED ORDER — ZOLPIDEM TARTRATE 5 MG PO TABS: 5.0000 mg | ORAL_TABLET | Freq: Every evening | ORAL | Status: DC | PRN

## 2017-11-23 MED ORDER — OXYTOCIN 40 UNITS IN LACTATED RINGERS INFUSION - SIMPLE MED
1.0000 m[IU]/min | INTRAVENOUS | Status: DC
  Administered 2017-11-23: 2 m[IU]/min via INTRAVENOUS
  Filled 2017-11-23: qty 1000

## 2017-11-23 MED ORDER — SOD CITRATE-CITRIC ACID 500-334 MG/5ML PO SOLN: 30.0000 mL | ORAL | Status: DC | PRN

## 2017-11-23 MED ORDER — LACTATED RINGERS IV SOLN
INTRAVENOUS | Status: DC
  Administered 2017-11-23 (×2): via INTRAVENOUS

## 2017-11-23 MED ORDER — PRENATAL MULTIVITAMIN CH
1.0000 | ORAL_TABLET | Freq: Every day | ORAL | Status: DC
  Administered 2017-11-24: 1 via ORAL
  Filled 2017-11-23: qty 1

## 2017-11-23 MED ORDER — IBUPROFEN 600 MG PO TABS
600.0000 mg | ORAL_TABLET | Freq: Four times a day (QID) | ORAL | Status: DC
  Administered 2017-11-23 – 2017-11-24 (×4): 600 mg via ORAL
  Filled 2017-11-23 (×4): qty 1

## 2017-11-23 MED ORDER — TERBUTALINE SULFATE 1 MG/ML IJ SOLN
0.2500 mg | Freq: Once | INTRAMUSCULAR | Status: DC | PRN
  Filled 2017-11-23: qty 1

## 2017-11-23 MED ORDER — LIDOCAINE HCL (PF) 1 % IJ SOLN
30.0000 mL | INTRAMUSCULAR | Status: DC | PRN
  Filled 2017-11-23: qty 30

## 2017-11-23 MED ORDER — MAGNESIUM HYDROXIDE 400 MG/5ML PO SUSP: 30.0000 mL | ORAL | Status: DC | PRN

## 2017-11-23 MED ORDER — PHENYLEPHRINE 40 MCG/ML (10ML) SYRINGE FOR IV PUSH (FOR BLOOD PRESSURE SUPPORT)
80.0000 ug | PREFILLED_SYRINGE | INTRAVENOUS | Status: DC | PRN
  Filled 2017-11-23: qty 5

## 2017-11-23 MED ORDER — ACETAMINOPHEN 325 MG PO TABS
650.0000 mg | ORAL_TABLET | ORAL | Status: DC | PRN
  Administered 2017-11-23 – 2017-11-24 (×2): 650 mg via ORAL
  Filled 2017-11-23 (×2): qty 2

## 2017-11-23 MED ORDER — OXYCODONE-ACETAMINOPHEN 5-325 MG PO TABS: 1.0000 | ORAL_TABLET | ORAL | Status: DC | PRN

## 2017-11-23 MED ORDER — OXYTOCIN 40 UNITS IN LACTATED RINGERS INFUSION - SIMPLE MED: 2.5000 [IU]/h | INTRAVENOUS | Status: DC

## 2017-11-23 MED ORDER — BENZOCAINE-MENTHOL 20-0.5 % EX AERO
1.0000 "application " | INHALATION_SPRAY | CUTANEOUS | Status: DC | PRN
  Administered 2017-11-23: 1 via TOPICAL
  Filled 2017-11-23: qty 56

## 2017-11-23 MED ORDER — PENICILLIN G POTASSIUM 5000000 UNITS IJ SOLR
5.0000 10*6.[IU] | Freq: Once | INTRAMUSCULAR | Status: AC
  Administered 2017-11-23: 5 10*6.[IU] via INTRAVENOUS
  Filled 2017-11-23: qty 5

## 2017-11-23 MED ORDER — LIDOCAINE HCL (PF) 1 % IJ SOLN
INTRAMUSCULAR | Status: DC | PRN
  Administered 2017-11-23: 6 mL via EPIDURAL

## 2017-11-23 MED ORDER — DIPHENHYDRAMINE HCL 25 MG PO CAPS: 25.0000 mg | ORAL_CAPSULE | Freq: Four times a day (QID) | ORAL | Status: DC | PRN

## 2017-11-23 MED ORDER — TETANUS-DIPHTH-ACELL PERTUSSIS 5-2.5-18.5 LF-MCG/0.5 IM SUSP: 0.5000 mL | Freq: Once | INTRAMUSCULAR | Status: DC

## 2017-11-23 MED ORDER — LACTATED RINGERS IV SOLN
500.0000 mL | Freq: Once | INTRAVENOUS | Status: AC
  Administered 2017-11-23: 500 mL via INTRAVENOUS

## 2017-11-23 MED ORDER — ONDANSETRON HCL 4 MG/2ML IJ SOLN: 4.0000 mg | Freq: Four times a day (QID) | INTRAMUSCULAR | Status: DC | PRN

## 2017-11-23 MED ORDER — BUTORPHANOL TARTRATE 1 MG/ML IJ SOLN: 1.0000 mg | INTRAMUSCULAR | Status: DC | PRN

## 2017-11-23 MED ORDER — EPHEDRINE 5 MG/ML INJ
10.0000 mg | INTRAVENOUS | Status: DC | PRN
  Filled 2017-11-23: qty 2

## 2017-11-23 MED ORDER — FENTANYL 2.5 MCG/ML BUPIVACAINE 1/10 % EPIDURAL INFUSION (WH - ANES)
14.0000 mL/h | INTRAMUSCULAR | Status: DC | PRN
  Administered 2017-11-23: 14 mL/h via EPIDURAL
  Filled 2017-11-23: qty 100

## 2017-11-23 MED ORDER — SENNOSIDES-DOCUSATE SODIUM 8.6-50 MG PO TABS
2.0000 | ORAL_TABLET | ORAL | Status: DC
  Administered 2017-11-23: 2 via ORAL
  Filled 2017-11-23: qty 2

## 2017-11-23 MED ORDER — METHYLERGONOVINE MALEATE 0.2 MG PO TABS: 0.2000 mg | ORAL_TABLET | ORAL | Status: DC | PRN

## 2017-11-23 MED ORDER — DIBUCAINE 1 % RE OINT: 1.0000 "application " | TOPICAL_OINTMENT | RECTAL | Status: DC | PRN

## 2017-11-23 MED ORDER — OXYCODONE HCL 5 MG PO TABS: 10.0000 mg | ORAL_TABLET | ORAL | Status: DC | PRN

## 2017-11-23 MED ORDER — OXYCODONE HCL 5 MG PO TABS
5.0000 mg | ORAL_TABLET | ORAL | Status: DC | PRN
  Administered 2017-11-24 (×2): 5 mg via ORAL
  Filled 2017-11-23 (×2): qty 1

## 2017-11-23 MED ORDER — ONDANSETRON HCL 4 MG PO TABS: 4.0000 mg | ORAL_TABLET | ORAL | Status: DC | PRN

## 2017-11-23 MED ORDER — ACETAMINOPHEN 325 MG PO TABS: 650.0000 mg | ORAL_TABLET | ORAL | Status: DC | PRN

## 2017-11-23 MED ORDER — LACTATED RINGERS IV SOLN: 500.0000 mL | INTRAVENOUS | Status: DC | PRN

## 2017-11-23 MED ORDER — MEASLES, MUMPS & RUBELLA VAC ~~LOC~~ INJ: 0.5000 mL | INJECTION | Freq: Once | SUBCUTANEOUS | Status: DC

## 2017-11-23 MED ORDER — SODIUM BICARBONATE 8.4 % IV SOLN
INTRAVENOUS | Status: DC | PRN
  Administered 2017-11-23: 3 mL via EPIDURAL
  Administered 2017-11-23: 2 mL via EPIDURAL

## 2017-11-23 MED ORDER — COCONUT OIL OIL: 1.0000 "application " | TOPICAL_OIL | Status: DC | PRN

## 2017-11-23 MED ORDER — METHYLERGONOVINE MALEATE 0.2 MG/ML IJ SOLN: 0.2000 mg | INTRAMUSCULAR | Status: DC | PRN

## 2017-11-23 MED ORDER — PENICILLIN G POT IN DEXTROSE 60000 UNIT/ML IV SOLN
3.0000 10*6.[IU] | INTRAVENOUS | Status: DC
  Administered 2017-11-23: 3 10*6.[IU] via INTRAVENOUS
  Filled 2017-11-23 (×4): qty 50

## 2017-11-23 MED ORDER — OXYCODONE-ACETAMINOPHEN 5-325 MG PO TABS: 2.0000 | ORAL_TABLET | ORAL | Status: DC | PRN

## 2017-11-23 MED ORDER — DIPHENHYDRAMINE HCL 50 MG/ML IJ SOLN: 12.5000 mg | INTRAMUSCULAR | Status: DC | PRN

## 2017-11-23 MED ORDER — ONDANSETRON HCL 4 MG/2ML IJ SOLN: 4.0000 mg | INTRAMUSCULAR | Status: DC | PRN

## 2017-11-23 MED ORDER — OXYTOCIN BOLUS FROM INFUSION
500.0000 mL | Freq: Once | INTRAVENOUS | Status: AC
  Administered 2017-11-23: 500 mL via INTRAVENOUS

## 2017-11-23 MED ORDER — SIMETHICONE 80 MG PO CHEW: 80.0000 mg | CHEWABLE_TABLET | ORAL | Status: DC | PRN

## 2017-11-23 MED ORDER — PHENYLEPHRINE 40 MCG/ML (10ML) SYRINGE FOR IV PUSH (FOR BLOOD PRESSURE SUPPORT)
80.0000 ug | PREFILLED_SYRINGE | INTRAVENOUS | Status: DC | PRN
  Filled 2017-11-23: qty 10
  Filled 2017-11-23: qty 5

## 2017-11-23 NOTE — Anesthesia Procedure Notes (Signed)
Epidural Patient location during procedure: OB Start time: 11/23/2017 2:01 PM End time: 11/23/2017 2:05 PM  Staffing Anesthesiologist: Lyn Hollingshead, MD Performed: anesthesiologist   Preanesthetic Checklist Completed: patient identified, site marked, surgical consent, pre-op evaluation, timeout performed, IV checked, risks and benefits discussed and monitors and equipment checked  Epidural Patient position: sitting Prep: site prepped and draped and DuraPrep Patient monitoring: continuous pulse ox and blood pressure Approach: midline Location: L3-L4 Injection technique: LOR air  Needle:  Needle type: Tuohy  Needle gauge: 17 G Needle length: 9 cm and 9 Needle insertion depth: 6 cm Catheter type: closed end flexible Catheter size: 19 Gauge Catheter at skin depth: 11 cm Test dose: negative and Other  Assessment Sensory level: T10 Events: blood not aspirated, injection not painful, no injection resistance, negative IV test and no paresthesia  Additional Notes Reason for block:procedure for pain

## 2017-11-23 NOTE — Anesthesia Pain Management Evaluation Note (Signed)
  CRNA Pain Management Visit Note  Patient: Jessica Warren, 26 y.o., female  "Hello I am a member of the anesthesia team at [facility]. We have an anesthesia team available at all times to provide care throughout the hospital, including epidural management and anesthesia for C-section. I don't know your plan for the delivery whether it a natural birth, water birth, IV sedation, nitrous supplementation, doula or epidural, but we want to meet your pain goals."   1.Was your pain managed to your expectations on prior hospitalizations?   Yes   2.What is your expectation for pain management during this hospitalization?     Epidural  3.How can we help you reach that goal?   Record the patient's initial score and the patient's pain goal.   Pain: 2  Pain Goal: 7 [facility] wants you to be able to say your pain was always managed very well.  Jabier Mutton 11/23/2017

## 2017-11-23 NOTE — Anesthesia Postprocedure Evaluation (Signed)
Anesthesia Post Note  Patient: Jessica Warren  Procedure(s) Performed: AN AD [place]     Patient location during evaluation: Mother Baby Anesthesia Type: Epidural Level of consciousness: awake and alert Pain management: pain level controlled Vital Signs Assessment: post-procedure vital signs reviewed and stable Respiratory status: spontaneous breathing, nonlabored ventilation and respiratory function stable Cardiovascular status: stable Postop Assessment: no headache, no backache and epidural receding Anesthetic complications: no    Last Vitals:  Vitals:   11/23/17 1708 11/23/17 1800  BP: 111/65 (!) 98/56  Pulse: 66 84  Resp: 16 17  Temp: 36.6 C 37 C  SpO2: 99% 98%    Last Pain:  Vitals:   11/23/17 1800  TempSrc: Oral  PainSc:    Pain Goal: Patients Stated Pain Goal: 7 (11/23/17 1227)               Gilmer Mor

## 2017-11-23 NOTE — Progress Notes (Signed)
Feeling some ctx Afeb, VSS FHT- Cat I VE-2/50/-2, vtx, had SROM clear just prior to exam Continue pitocin and PCN, monitor progress

## 2017-11-23 NOTE — H&P (Signed)
Jessica Warren is a 27 y.o. female, G4 P42, EGA 39+ weeks presenting for elective induction with favorable cervix.  Prenatal care essentially uncomplicated, previous baby with GBS.  OB History    Gravida Para Term Preterm AB Living   4 2 2   1 2    SAB TAB Ectopic Multiple Live Births     1     2     Past Medical History:  Diagnosis Date  . Fibroadenoma of left breast 12/2014   Past Surgical History:  Procedure Laterality Date  . BREAST LUMPECTOMY Left 01/08/2015   Procedure: REMOVAL OF FIBROADENOMA LEFT BREAST;  Surgeon: Jackolyn Confer, MD;  Location: [place];  Service: General;  Laterality: Left;  . NO PAST SURGERIES     Family History: family history includes Hyperlipidemia in her mother; Stroke in her brother and father. Social History:  reports that  has never smoked. she has never used smokeless tobacco. She reports that she does not drink alcohol or use drugs.     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   Cardiovascular: Negative.    Maternal Medical History:  Fetal activity: Perceived fetal activity is normal.    Prenatal complications: no prenatal complications Prenatal Complications - Diabetes: none.      Blood pressure 126/78, pulse 98, resp. rate 20, height 5\' 2"  (1.575 m), weight 194 lb (88 kg). Maternal Exam:  Abdomen: Patient reports no abdominal tenderness. Estimated fetal weight is 7 1/2 lbs.   Fetal presentation: vertex  Introitus: Normal vulva. Normal vagina.  Amniotic fluid character: not assessed.  Pelvis: adequate for delivery.   Cervix: Cervix evaluated by sterile speculum exam.     Fetal Exam Fetal Monitor Review:  Mode: ultrasound.   Baseline rate: 120.  Variability: moderate (6-25 bpm).   Pattern: accelerations present and no decelerations.    Fetal State Assessment: Category I - tracings are normal.     Physical Exam  Vitals reviewed. Constitutional: She appears well-developed and well-nourished.  Cardiovascular: Normal rate and regular rhythm.  Respiratory: Effort normal. No respiratory distress.  GI: Soft.    Prenatal labs: ABO, Rh: A/Positive/-- (06/21 0000) Antibody: Negative (06/21 0000) Rubella: Immune (06/21 0000) RPR: Nonreactive (06/21 0000)  HBsAg: Negative (06/21 0000)  HIV: Non-reactive (06/21 0000)  GBS:   none  Assessment/Plan: IUP at 39+ weeks with favorable cervix for induction.  Will start pitocin, PCN for GBS prophylaxis since previous baby with disease.   Blane Ohara Jayleana Colberg 11/23/2017, 8:10 AM

## 2017-11-23 NOTE — Anesthesia Preprocedure Evaluation (Signed)
Anesthesia Evaluation  Patient identified by MRN, date of birth, ID band Patient awake    Reviewed: Allergy & Precautions, H&P , NPO status , Patient's Chart, lab work & pertinent test results  Airway Mallampati: II  TM Distance: >3 FB Neck ROM: full    Dental no notable dental hx.    Pulmonary neg pulmonary ROS,    Pulmonary exam normal breath sounds clear to auscultation       Cardiovascular negative cardio ROS Normal cardiovascular exam Rhythm:regular Rate:Normal     Neuro/Psych negative neurological ROS  negative psych ROS   GI/Hepatic negative GI ROS, Neg liver ROS,   Endo/Other  negative endocrine ROS  Renal/GU negative Renal ROS     Musculoskeletal negative musculoskeletal ROS (+)   Abdominal (+) + obese,   Peds  Hematology  (+) Blood dyscrasia, anemia ,   Anesthesia Other Findings   Reproductive/Obstetrics (+) Pregnancy                             Anesthesia Physical Anesthesia Plan  ASA: II  Anesthesia Plan: Epidural   Post-op Pain Management:    Induction:   PONV Risk Score and Plan:   Airway Management Planned:   Additional Equipment:   Intra-op Plan:   Post-operative Plan:   Informed Consent: I have reviewed the patients History and Physical, chart, labs and discussed the procedure including the risks, benefits and alternatives for the proposed anesthesia with the patient or authorized representative who has indicated his/her understanding and acceptance.     Plan Discussed with:   Anesthesia Plan Comments:         Anesthesia Quick Evaluation

## 2017-11-24 LAB — BIRTH TISSUE RECOVERY COLLECTION (PLACENTA DONATION)

## 2017-11-24 MED ORDER — PRENATAL MULTIVITAMIN CH: 1.0000 | ORAL_TABLET | Freq: Every day | ORAL | 3 refills | Status: DC

## 2017-11-24 MED ORDER — IBUPROFEN 600 MG PO TABS: 600.0000 mg | ORAL_TABLET | Freq: Four times a day (QID) | ORAL | 1 refills | Status: DC | PRN

## 2017-11-24 NOTE — Plan of Care (Signed)
Patient does not voice any concerns today.  She is doing well, showing no signs of infection or complications.  Patient is working on pain control.  She has been educated on alternating ibuprofen and tylenol at home along with using heat to help with cramping.

## 2017-11-24 NOTE — Discharge Summary (Signed)
OB Discharge Summary     Patient Name: Jessica Warren DOB: Dec 17, 1990 MRN: [id number]  Date of admission: 11/23/2017 Delivering MD: Willis Modena, TODD   Date of discharge: 11/24/2017  Admitting diagnosis: INDUCTION Intrauterine pregnancy: 101w6d     Secondary diagnosis:  Active Problems:   Indication for care in labor or delivery  Additional problems: n/a     Discharge diagnosis: Term Pregnancy Delivered                                                                                                Post partum procedures:none  Augmentation: AROM and Pitocin  Complications: None  Hospital course:  Induction of Labor With Vaginal Delivery   27 y.o. yo [phone] at [redacted]w[redacted]d was admitted to the hospital 11/23/2017 for induction of labor.  Indication for induction: Favorable cervix at term.  Patient had an uncomplicated labor course as follows: Membrane Rupture Time/Date: 12:22 PM ,11/23/2017   Intrapartum Procedures: Episiotomy: None [1]                                         Lacerations:  Periurethral [8]  Patient had delivery of a Viable infant.  Information for the patient's newborn:  Symphanie, Cederberg [[id number]]  Delivery Method: Vaginal, Spontaneous(Filed from Delivery Summary)   11/23/2017  Details of delivery can be found in separate delivery note.  Patient had a routine postpartum course. Patient is discharged home 11/24/17.  Physical exam  Vitals:   11/23/17 1708 11/23/17 1800 11/23/17 2200 11/24/17 0524  BP: 111/65 (!) 98/56 105/68 109/64  Pulse: 66 84 66 75  Resp: 16 17 20 18   Temp: 97.9 F (36.6 C) 98.6 F (37 C) 98.4 F (36.9 C) 98.2 F (36.8 C)  TempSrc: Oral Oral Oral Oral  SpO2: 99% 98% 97%   Weight:      Height:       General: alert and no distress Lochia: appropriate Uterine Fundus: firm  Labs: Lab Results  Component Value Date   WBC 13.2 (H) 11/23/2017   HGB 11.9 (L) 11/23/2017   HCT 35.4 (L) 11/23/2017   MCV 84.5 11/23/2017   PLT 272 11/23/2017    No flowsheet data found.  Discharge instruction: per After Visit Summary and "Baby and Me Booklet".  After visit meds:  Allergies as of 11/24/2017      Reactions   Adhesive [tape] Hives   Cherry Hives      Medication List    TAKE these medications   acetaminophen 500 MG tablet Commonly known as:  TYLENOL Take 1,000 mg by mouth every 6 (six) hours as needed for mild pain or headache.   ibuprofen 600 MG tablet Commonly known as:  ADVIL,MOTRIN Take 1 tablet (600 mg total) by mouth every 6 (six) hours as needed for moderate pain.   prenatal multivitamin Tabs tablet Take 1 tablet by mouth daily at 12 noon.       Diet: routine diet  Activity: Advance as tolerated. Pelvic rest for 6  weeks.   Outpatient follow up:6 weeks Follow up Appt:No future appointments. Follow up Visit:No Follow-up on file.  Postpartum contraception: Undecided  Newborn Data: Live born female  Birth Weight: 7 lb 1.2 oz (3210 g) APGAR: 30, 9  Newborn Delivery   Birth date/time:  11/23/2017 14:59:00 Delivery type:  Vaginal, Spontaneous     Baby Feeding: Bottle Disposition:home with mother   11/24/2017 Janyth Contes, MD

## 2017-11-24 NOTE — Progress Notes (Addendum)
Post Partum Day 1 Subjective: no complaints, up ad lib, voiding, tolerating PO and nl lochia, pain controlled  Objective: Blood pressure 109/64, pulse 75, temperature 98.2 F (36.8 C), temperature source Oral, resp. rate 18, height 5\' 2"  (1.575 m), weight 88 kg (194 lb), SpO2 97 %, unknown if currently breastfeeding.  Physical Exam:  General: alert and no distress Lochia: appropriate Uterine Fundus: firm  Recent Labs    11/23/17 0731  HGB 11.9*  HCT 35.4*    Assessment/Plan: Plan for discharge tomorrow.  Routine PP care.    Pt desires d/c to home today.  Will d/c after 3pm if OK with peds.  Will d/c with motrin and PNV.  F/u 6 weeks   LOS: 1 day   Jessica Warren 11/24/2017, 7:27 AM

## 2017-12-21 NOTE — Patient Instructions (Addendum)
Your procedure is scheduled on: Wednesday, March 13  Enter through the [employer] [facility] at: 7 am  Pick up the phone at the desk and dial [phone].  Call this number if you have problems the morning of surgery: [phone].  Remember: Do NOT eat or Do NOT drink clear liquids (including water) after midnight Tuesday  Take these medicines the morning of surgery with a SIP OF WATER: None  Do NOT wear jewelry (body piercing), metal hair clips/bobby pins, make-up, or nail polish. Do NOT wear lotions, powders, or perfumes.  You may wear deoderant. Do NOT shave for 48 hours prior to surgery. Do NOT bring valuables to the hospital.  Have a responsible adult drive you home and stay with you for 24 hours after your procedure.  Home with Fiance Jessica Warren cell [phone].

## 2017-12-28 ENCOUNTER — Encounter (HOSPITAL_COMMUNITY): Payer: Self-pay

## 2017-12-28 ENCOUNTER — Encounter (HOSPITAL_COMMUNITY)
Admission: RE | Admit: 2017-12-28 | Discharge: 2017-12-28 | Disposition: A | Discharge status: Home / Self Care | Payer: Medicaid Other | Source: Ambulatory Visit | Attending: Obstetrics and Gynecology | Admitting: Obstetrics and Gynecology

## 2017-12-28 ENCOUNTER — Other Ambulatory Visit: Payer: Self-pay

## 2017-12-28 DIAGNOSIS — Z01812 Encounter for preprocedural laboratory examination: Secondary | ICD-10-CM | POA: Insufficient documentation

## 2017-12-28 HISTORY — DX: Headache, unspecified: R51.9

## 2017-12-28 HISTORY — DX: Headache: R51

## 2017-12-28 HISTORY — DX: Encounter for elective termination of pregnancy: Z33.2

## 2017-12-28 LAB — CBC
HCT: 42 % (ref 36.0–46.0)
HEMOGLOBIN: 13.9 g/dL (ref 12.0–15.0)
MCH: 27.6 pg (ref 26.0–34.0)
MCHC: 33.1 g/dL (ref 30.0–36.0)
MCV: 83.3 fL (ref 78.0–100.0)
Platelets: 248 10*3/uL (ref 150–400)
RBC: 5.04 MIL/uL (ref 3.87–5.11)
RDW: 12.3 % (ref 11.5–15.5)
WBC: 9.5 10*3/uL (ref 4.0–10.5)

## 2018-01-05 NOTE — H&P (Signed)
Jessica Warren is an 27 y.o. female. She is s/p SVD 1-28, desires permanent sterility.  Pertinent Gynecological History: Last pap: normal Date: 12/2017 OB History: G4, P3013   Menstrual History: No LMP recorded.    Past Medical History:  Diagnosis Date  . 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 Headache    otc med prn  . SVD (spontaneous vaginal delivery)    x 3  . Termination of pregnancy (fetus)    at age 72 - clinic    Past Surgical History:  Procedure Laterality Date  . BREAST LUMPECTOMY Left 01/08/2015   Procedure: REMOVAL OF FIBROADENOMA LEFT BREAST;  Surgeon: Jackolyn Confer, MD;  Location: [facility];  Service: General;  Laterality: Left;  . WISDOM TOOTH EXTRACTION      Family History  Problem Relation Age of Onset  . Hyperlipidemia Mother   . Stroke Father   . Stroke Brother     Social History:  reports that  has never smoked. she has never used smokeless tobacco. She reports that she drinks alcohol. She reports that she does not use drugs.  Allergies:  Allergies  Allergen Reactions  . Adhesive [Tape] Hives  . Cherry Hives    No medications prior to admission.    Review of Systems  Respiratory: Negative.   Cardiovascular: Negative.     not currently breastfeeding. Physical Exam  Constitutional: She appears well-developed and well-nourished.  Neck: Neck supple. No thyromegaly present.  Cardiovascular: Normal rate, regular rhythm and normal heart sounds.  No murmur heard. Respiratory: Effort normal and breath sounds normal. No respiratory distress. She has no wheezes.  GI: Soft. She exhibits no distension and no mass. There is no tenderness.  Genitourinary: Vagina normal and uterus normal.    No results found for this or any previous visit (from the past 24 hour(s)).  No results found.  Assessment/Plan: Desires permanent sterility.  Discussed all options for contraception, permanency of BTL and failure rate.  Discussed surgical  procedure and risks.  Will admit for laparoscopic bilateral tubal fulguration.      Blane Ohara Jovoni Borkenhagen 01/05/2018, 8:40 PM

## 2018-01-05 NOTE — Anesthesia Preprocedure Evaluation (Addendum)
Anesthesia Evaluation  Patient identified by MRN, date of birth, ID band Patient awake    Reviewed: Allergy & Precautions, NPO status , Patient's Chart, lab work & pertinent test results  Airway Mallampati: III  TM Distance: >3 FB Neck ROM: Full    Dental no notable dental hx.    Pulmonary neg pulmonary ROS,    Pulmonary exam normal breath sounds clear to auscultation       Cardiovascular negative cardio ROS Normal cardiovascular exam Rhythm:Regular Rate:Normal     Neuro/Psych  Headaches, negative psych ROS   GI/Hepatic negative GI ROS, Neg liver ROS,   Endo/Other  negative endocrine ROS  Renal/GU negative Renal ROS     Musculoskeletal negative musculoskeletal ROS (+)   Abdominal (+) + obese,   Peds  Hematology negative hematology ROS (+)   Anesthesia Other Findings Desires sterilization  Reproductive/Obstetrics hcg negative                          Anesthesia Physical Anesthesia Plan  ASA: II  Anesthesia Plan: General   Post-op Pain Management:    Induction: Intravenous  PONV Risk Score and Plan: 4 or greater and Scopolamine patch - Pre-op, Midazolam, Dexamethasone, Ondansetron and Treatment may vary due to age or medical condition  Airway Management Planned: Oral ETT  Additional Equipment:   Intra-op Plan:   Post-operative Plan: Extubation in OR  Informed Consent: I have reviewed the patients History and Physical, chart, labs and discussed the procedure including the risks, benefits and alternatives for the proposed anesthesia with the patient or authorized representative who has indicated his/her understanding and acceptance.   Dental advisory given  Plan Discussed with: CRNA  Anesthesia Plan Comments:        Anesthesia Quick Evaluation

## 2018-01-06 ENCOUNTER — Encounter (HOSPITAL_COMMUNITY)
Admission: RE | Disposition: A | Discharge status: Home / Self Care | Payer: Self-pay | Source: Ambulatory Visit | Attending: Obstetrics and Gynecology

## 2018-01-06 ENCOUNTER — Ambulatory Visit (HOSPITAL_COMMUNITY): Payer: Medicaid Other | Admitting: Anesthesiology

## 2018-01-06 ENCOUNTER — Ambulatory Visit (HOSPITAL_COMMUNITY)
Admission: RE | Admit: 2018-01-06 | Discharge: 2018-01-06 | Disposition: A | Discharge status: Home / Self Care | Payer: Medicaid Other | Source: Ambulatory Visit | Attending: Obstetrics and Gynecology | Admitting: Obstetrics and Gynecology

## 2018-01-06 ENCOUNTER — Other Ambulatory Visit: Payer: Self-pay

## 2018-01-06 ENCOUNTER — Encounter (HOSPITAL_COMMUNITY): Payer: Self-pay

## 2018-01-06 DIAGNOSIS — Z302 Encounter for sterilization: Secondary | ICD-10-CM | POA: Diagnosis present

## 2018-01-06 HISTORY — PX: LAPAROSCOPIC TUBAL LIGATION: SHX1937

## 2018-01-06 LAB — PREGNANCY, URINE: PREG TEST UR: NEGATIVE

## 2018-01-06 SURGERY — LIGATION, FALLOPIAN TUBE, LAPAROSCOPIC
Anesthesia: General | Site: Abdomen | Laterality: Bilateral

## 2018-01-06 MED ORDER — FENTANYL CITRATE (PF) 100 MCG/2ML IJ SOLN
INTRAMUSCULAR | Status: DC | PRN
  Administered 2018-01-06: 50 ug via INTRAVENOUS
  Administered 2018-01-06: 100 ug via INTRAVENOUS

## 2018-01-06 MED ORDER — ONDANSETRON HCL 4 MG/2ML IJ SOLN
INTRAMUSCULAR | Status: AC
  Filled 2018-01-06: qty 2

## 2018-01-06 MED ORDER — OXYCODONE HCL 5 MG/5ML PO SOLN: 5.0000 mg | Freq: Once | ORAL | Status: AC | PRN

## 2018-01-06 MED ORDER — DEXAMETHASONE SODIUM PHOSPHATE 10 MG/ML IJ SOLN
INTRAMUSCULAR | Status: AC
  Filled 2018-01-06: qty 1

## 2018-01-06 MED ORDER — FAMOTIDINE 20 MG PO TABS
ORAL_TABLET | ORAL | Status: AC
  Filled 2018-01-06: qty 1

## 2018-01-06 MED ORDER — SCOPOLAMINE 1 MG/3DAYS TD PT72
MEDICATED_PATCH | TRANSDERMAL | Status: AC
  Filled 2018-01-06: qty 1

## 2018-01-06 MED ORDER — OXYCODONE HCL 5 MG PO TABS
ORAL_TABLET | ORAL | Status: AC
  Filled 2018-01-06: qty 1

## 2018-01-06 MED ORDER — PROPOFOL 10 MG/ML IV BOLUS
INTRAVENOUS | Status: DC | PRN
  Administered 2018-01-06: 180 mg via INTRAVENOUS

## 2018-01-06 MED ORDER — LIDOCAINE HCL (CARDIAC) 20 MG/ML IV SOLN
INTRAVENOUS | Status: DC | PRN
  Administered 2018-01-06 (×2): 50 mg via INTRAVENOUS

## 2018-01-06 MED ORDER — FAMOTIDINE 20 MG PO TABS
20.0000 mg | ORAL_TABLET | Freq: Once | ORAL | Status: AC
  Administered 2018-01-06: 20 mg via ORAL

## 2018-01-06 MED ORDER — OXYCODONE HCL 5 MG PO TABS
5.0000 mg | ORAL_TABLET | Freq: Once | ORAL | Status: AC | PRN
  Administered 2018-01-06: 5 mg via ORAL

## 2018-01-06 MED ORDER — LACTATED RINGERS IV SOLN
INTRAVENOUS | Status: DC
  Administered 2018-01-06: 125 mL/h via INTRAVENOUS

## 2018-01-06 MED ORDER — SCOPOLAMINE 1 MG/3DAYS TD PT72
MEDICATED_PATCH | TRANSDERMAL | Status: AC
  Administered 2018-01-06: 1.5 mg via TRANSDERMAL
  Filled 2018-01-06: qty 1

## 2018-01-06 MED ORDER — SCOPOLAMINE 1 MG/3DAYS TD PT72
1.0000 | MEDICATED_PATCH | Freq: Once | TRANSDERMAL | Status: DC
  Administered 2018-01-06: 1.5 mg via TRANSDERMAL

## 2018-01-06 MED ORDER — CELECOXIB 200 MG PO CAPS
ORAL_CAPSULE | ORAL | Status: AC
  Administered 2018-01-06: 200 mg via ORAL
  Filled 2018-01-06: qty 1

## 2018-01-06 MED ORDER — FENTANYL CITRATE (PF) 250 MCG/5ML IJ SOLN
INTRAMUSCULAR | Status: AC
  Filled 2018-01-06: qty 5

## 2018-01-06 MED ORDER — SUGAMMADEX SODIUM 200 MG/2ML IV SOLN
INTRAVENOUS | Status: DC | PRN
  Administered 2018-01-06: 200 mg via INTRAVENOUS

## 2018-01-06 MED ORDER — ACETAMINOPHEN 500 MG PO TABS
1000.0000 mg | ORAL_TABLET | Freq: Once | ORAL | Status: AC
Start: 2018-01-06 — End: 2018-01-06
  Administered 2018-01-06: 1000 mg via ORAL

## 2018-01-06 MED ORDER — SODIUM CHLORIDE 0.9 % IJ SOLN
INTRAMUSCULAR | Status: DC | PRN
  Administered 2018-01-06: 10 mL

## 2018-01-06 MED ORDER — ROCURONIUM BROMIDE 100 MG/10ML IV SOLN
INTRAVENOUS | Status: DC | PRN
  Administered 2018-01-06: 10 mg via INTRAVENOUS
  Administered 2018-01-06: 30 mg via INTRAVENOUS

## 2018-01-06 MED ORDER — ONDANSETRON HCL 4 MG/2ML IJ SOLN
INTRAMUSCULAR | Status: DC | PRN
  Administered 2018-01-06: 4 mg via INTRAVENOUS

## 2018-01-06 MED ORDER — ACETAMINOPHEN 500 MG PO TABS
ORAL_TABLET | ORAL | Status: AC
  Filled 2018-01-06: qty 2

## 2018-01-06 MED ORDER — LIDOCAINE HCL (CARDIAC) 20 MG/ML IV SOLN
INTRAVENOUS | Status: AC
Start: 2018-01-06 — End: ?
  Filled 2018-01-06: qty 5

## 2018-01-06 MED ORDER — BUPIVACAINE HCL (PF) 0.25 % IJ SOLN
INTRAMUSCULAR | Status: AC
  Filled 2018-01-06: qty 30

## 2018-01-06 MED ORDER — BUPIVACAINE HCL (PF) 0.25 % IJ SOLN
INTRAMUSCULAR | Status: DC | PRN
  Administered 2018-01-06: 6 mL

## 2018-01-06 MED ORDER — SODIUM CHLORIDE 0.9 % IJ SOLN
INTRAMUSCULAR | Status: AC
  Filled 2018-01-06: qty 10

## 2018-01-06 MED ORDER — MIDAZOLAM HCL 2 MG/2ML IJ SOLN
INTRAMUSCULAR | Status: DC | PRN
  Administered 2018-01-06: 2 mg via INTRAVENOUS

## 2018-01-06 MED ORDER — HYDROMORPHONE HCL 1 MG/ML IJ SOLN: 0.2500 mg | INTRAMUSCULAR | Status: DC | PRN

## 2018-01-06 MED ORDER — PROPOFOL 10 MG/ML IV BOLUS
INTRAVENOUS | Status: AC
  Filled 2018-01-06: qty 20

## 2018-01-06 MED ORDER — MIDAZOLAM HCL 2 MG/2ML IJ SOLN
INTRAMUSCULAR | Status: AC
  Filled 2018-01-06: qty 2

## 2018-01-06 MED ORDER — LACTATED RINGERS IV SOLN: INTRAVENOUS | Status: DC

## 2018-01-06 MED ORDER — HYDROCODONE-ACETAMINOPHEN 5-325 MG PO TABS: 1.0000 | ORAL_TABLET | Freq: Four times a day (QID) | ORAL | 0 refills | Status: DC | PRN

## 2018-01-06 MED ORDER — PROMETHAZINE HCL 25 MG/ML IJ SOLN: 6.2500 mg | INTRAMUSCULAR | Status: DC | PRN

## 2018-01-06 MED ORDER — DEXAMETHASONE SODIUM PHOSPHATE 10 MG/ML IJ SOLN
INTRAMUSCULAR | Status: DC | PRN
  Administered 2018-01-06: 10 mg via INTRAVENOUS

## 2018-01-06 MED ORDER — CELECOXIB 200 MG PO CAPS
200.0000 mg | ORAL_CAPSULE | Freq: Once | ORAL | Status: AC
  Administered 2018-01-06: 200 mg via ORAL

## 2018-01-06 SURGICAL SUPPLY — 26 items
CATH ROBINSON RED A/P 16FR (CATHETERS) ×2 IMPLANT
DERMABOND ADVANCED (GAUZE/BANDAGES/DRESSINGS) ×1
DERMABOND ADVANCED .7 DNX12 (GAUZE/BANDAGES/DRESSINGS) ×1 IMPLANT
DRSG OPSITE POSTOP 3X4 (GAUZE/BANDAGES/DRESSINGS) ×2 IMPLANT
DURAPREP 26ML APPLICATOR (WOUND CARE) ×2 IMPLANT
GLOVE BIO SURGEON STRL SZ8 (GLOVE) ×2 IMPLANT
GLOVE BIOGEL PI IND STRL 7.0 (GLOVE) ×2 IMPLANT
GLOVE BIOGEL PI INDICATOR 7.0 (GLOVE) ×2
GLOVE ORTHO TXT STRL SZ7.5 (GLOVE) ×2 IMPLANT
GOWN STRL REUS W/TWL LRG LVL3 (GOWN DISPOSABLE) ×4 IMPLANT
NEEDLE INSUFFLATION 120MM (ENDOMECHANICALS) ×2 IMPLANT
PACK LAPAROSCOPY BASIN (CUSTOM PROCEDURE TRAY) ×2 IMPLANT
PACK TRENDGUARD 450 HYBRID PRO (MISCELLANEOUS) IMPLANT
PACK TRENDGUARD 600 HYBRD PROC (MISCELLANEOUS) IMPLANT
PROTECTOR NERVE ULNAR (MISCELLANEOUS) ×2 IMPLANT
SLEEVE XCEL OPT CAN 5 100 (ENDOMECHANICALS) IMPLANT
SUT VIC AB 3-0 CTX 36 (SUTURE) IMPLANT
SUT VIC AB 3-0 PS2 18 (SUTURE) ×1
SUT VIC AB 3-0 PS2 18XBRD (SUTURE) ×1 IMPLANT
SUT VICRYL 0 UR6 27IN ABS (SUTURE) ×2 IMPLANT
TOWEL OR 17X24 6PK STRL BLUE (TOWEL DISPOSABLE) ×4 IMPLANT
TRENDGUARD 450 HYBRID PRO PACK (MISCELLANEOUS)
TRENDGUARD 600 HYBRID PROC PK (MISCELLANEOUS)
TROCAR XCEL NON-BLD 11X100MML (ENDOMECHANICALS) ×2 IMPLANT
TROCAR XCEL NON-BLD 5MMX100MML (ENDOMECHANICALS) ×2 IMPLANT
WARMER LAPAROSCOPE (MISCELLANEOUS) ×2 IMPLANT

## 2018-01-06 NOTE — Op Note (Signed)
Preoperative diagnosis: Desires surgical sterility Postoperative diagnosis: Same Procedure: Laparoscopic bilateral tubal fulguration Surgeon: Cheri Fowler M.D. Anesthesia: Gen. Endotracheal tube Findings: She had a normal pelvis, normal tubes and ovaries, normal upper abdomen, appendix and gallbladder Estimated blood loss: Minimal Complications: None  Procedure in detail: The patient was taken to the operating room and placed in the dorsosupine position. General anesthesia was induced. Her left arm was tucked to her side and legs were placed in mobile stirrups. Abdomen was then prepped and draped in the usual sterile fashion, bladder drained with a red Robinson catheter, Hulka tenaculum applied to the cervix for uterine manipulation. Infraumbilical skin was then infiltrated with quarter percent Marcaine and a 1 cm vertical incision was made. The Veress needle was inserted into the peritoneal cavity and placement confirmed by the water drop test an opening pressure of 2 mm of mercury. CO2 was insufflated to a pressure of 12 mm of mercury and the Veress needle was removed. A 10/11 disposable trocar was then introduced with direct visualization with the laparoscope. The operating scope was then inserted. Good visualization was achieved, inspection revealed the above-mentioned findings. Both fallopian tubes were easily identified and traced to their fimbriated ends. A 3 cm portion of the middle of each tube was fulgurated with bipolar cautery until the amp meter read 0 in all spots along a 3 cm segment. This was done on both sides with good fulguration of both tubes and good hemostasis. No complications were encountered. The laparoscope was removed. All gas was allowed to deflate from the abdomen and the trocar was removed. Fascia was approximated with one suture of 0 Vicryl. Skin incision was closed with interrupted subcuticular sutures of 4-0 Vicryl followed by Dermabond. The Hulka tenaculum was removed  and bleeding controlled with pressure. The patient was awakened in the operating room and taken to the recovery in stable condition after tolerating the procedure well. Counts were correct and she had PAS hose on throughout the procedure.

## 2018-01-06 NOTE — Transfer of Care (Signed)
Immediate Anesthesia Transfer of Care Note  Patient: Jessica Warren  Procedure(s) Performed: LAPAROSCOPIC TUBAL LIGATION (Bilateral Abdomen)  Patient Location: PACU  Anesthesia Type:General  Level of Consciousness: sedated  Airway & Oxygen Therapy: Patient Spontanous Breathing and Patient connected to nasal cannula oxygen  Post-op Assessment: Report given to RN  Post vital signs: Reviewed and stable  Last Vitals:  Vitals:   01/06/18 0720  BP: 132/84  Pulse: 64  Resp: 16  Temp: 36.6 C  SpO2: 99%    Last Pain:  Vitals:   01/06/18 0720  TempSrc: Oral      Patients Stated Pain Goal: 3 (03/13/32 5825)  Complications: No apparent anesthesia complications

## 2018-01-06 NOTE — Interval H&P Note (Signed)
History and Physical Interval Note:  01/06/2018 8:05 AM  Jessica Warren  has presented today for surgery, with the diagnosis of sterilization  The various methods of treatment have been discussed with the patient and family. After consideration of risks, benefits and other options for treatment, the patient has consented to  Procedure(s): LAPAROSCOPIC TUBAL LIGATION (Bilateral) as a surgical intervention .  The patient's history has been reviewed, patient examined, no change in status, stable for surgery.  I have reviewed the patient's chart and labs.  Questions were answered to the patient's satisfaction.     Blane Ohara Rian Busche

## 2018-01-06 NOTE — Discharge Instructions (Signed)
Routine instructions for laparoscopy  DISCHARGE INSTRUCTIONS: Laparoscopy  The following instructions have been prepared to help you care for yourself upon your return home today.  Wound care:  Do not get the incision wet for the first 24 hours. The incision should be kept clean and dry.  The Band-Aids or dressings may be removed the day after surgery.  Should the incision become sore, red, and swollen after the first week, check with your doctor.  Personal hygiene:  Shower the day after your procedure.  Activity and limitations:  Do NOT drive or operate any equipment today.  Do NOT lift anything more than 15 pounds for 2-3 weeks after surgery.  Do NOT rest in bed all day.  Walking is encouraged. Walk each day, starting slowly with 5-minute walks 3 or 4 times a day. Slowly increase the length of your walks.  Walk up and down stairs slowly.  Do NOT do strenuous activities, such as golfing, playing tennis, bowling, running, biking, weight lifting, gardening, mowing, or vacuuming for 2-4 weeks. Ask your doctor when it is okay to start.  Diet: Eat a light meal as desired this evening. You may resume your usual diet tomorrow.  Return to work: This is dependent on the type of work you do. For the most part you can return to a desk job within a week of surgery. If you are more active at work, please discuss this with your doctor.  What to expect after your surgery: You may have a slight burning sensation when you urinate on the first day. You may have a very small amount of blood in the urine. Expect to have a small amount of vaginal discharge/light bleeding for 1-2 weeks. It is not unusual to have abdominal soreness and bruising for up to 2 weeks. You may be tired and need more rest for about 1 week. You may experience shoulder pain for 24-72 hours. Lying flat in bed may relieve it.  Call your doctor for any of the following:  Develop a fever of 100.4 or greater  Inability to  urinate 6 hours after discharge from hospital  Severe pain not relieved by pain medications  Persistent of heavy bleeding at incision site  Redness or swelling around incision site after a week  Increasing nausea or vomiting

## 2018-01-06 NOTE — Anesthesia Procedure Notes (Signed)
Procedure Name: Intubation Date/Time: 01/06/2018 8:33 AM Performed by: Asher Muir, CRNA Pre-anesthesia Checklist: Patient identified, Emergency Drugs available, Suction available and Patient being monitored Patient Re-evaluated:Patient Re-evaluated prior to induction Oxygen Delivery Method: Circle system utilized and Simple face mask Preoxygenation: Pre-oxygenation with 100% oxygen Induction Type: IV induction Ventilation: Mask ventilation without difficulty Laryngoscope Size: Mac and 3 Grade View: Grade II Tube type: Oral Tube size: 7.0 mm Number of attempts: 1 Airway Equipment and Method: Stylet Placement Confirmation: ETT inserted through vocal cords under direct vision,  positive ETCO2 and breath sounds checked- equal and bilateral Secured at: 20 (right lip) cm Tube secured with: Tape Dental Injury: Teeth and Oropharynx as per pre-operative assessment

## 2018-01-06 NOTE — Anesthesia Postprocedure Evaluation (Signed)
Anesthesia Post Note  Patient: Jessica Warren  Procedure(s) Performed: LAPAROSCOPIC TUBAL LIGATION (Bilateral Abdomen)     Patient location during evaluation: PACU Anesthesia Type: General Level of consciousness: awake and alert Pain management: pain level controlled Vital Signs Assessment: post-procedure vital signs reviewed and stable Respiratory status: spontaneous breathing, nonlabored ventilation, respiratory function stable and patient connected to nasal cannula oxygen Cardiovascular status: blood pressure returned to baseline and stable Postop Assessment: no apparent nausea or vomiting Anesthetic complications: no    Last Vitals:  Vitals:   01/06/18 1045 01/06/18 1100  BP: (!) 137/93 121/80  Pulse: (!) 56 (!) 53  Resp: 14 16  Temp:  37.3 C  SpO2: 98% 99%    Last Pain:  Vitals:   01/06/18 1100  TempSrc:   PainSc: 3    Pain Goal: Patients Stated Pain Goal: 3 (01/06/18 1015)               Ryan P Ellender

## 2018-01-07 ENCOUNTER — Encounter (HOSPITAL_COMMUNITY): Payer: Self-pay | Admitting: Obstetrics and Gynecology

## 2019-04-01 ENCOUNTER — Telehealth: Payer: Self-pay | Admitting: Plastic Surgery

## 2019-04-01 NOTE — Telephone Encounter (Signed)

## 2019-04-04 ENCOUNTER — Ambulatory Visit (INDEPENDENT_AMBULATORY_CARE_PROVIDER_SITE_OTHER): Payer: BLUE CROSS/BLUE SHIELD | Admitting: Plastic Surgery

## 2019-04-04 ENCOUNTER — Encounter: Payer: Self-pay | Admitting: Plastic Surgery

## 2019-04-04 ENCOUNTER — Other Ambulatory Visit: Payer: Self-pay

## 2019-04-04 DIAGNOSIS — L989 Disorder of the skin and subcutaneous tissue, unspecified: Secondary | ICD-10-CM

## 2019-04-04 NOTE — Progress Notes (Signed)
   Subjective:    Patient ID: Jessica Warren, female    DOB: [birth year], 28 y.o.   MRN: [id number]  The patient is a 28 year old female here for evaluation of her nose.  She has a 1 cm raised, flesh colored lesion on the left side of her nasal sidewall.  She states it is been present for at least 12 years.  It seems to be getting larger.  The dermatologist thought maybe it was fluid-filled and tried to drain it.  Nothing drained.  It sometimes gets irritated.  She does not have any family history of skin cancer and no personal history of skin cancer.  She is otherwise healthy.  She had a child a year and a half ago.  No other skin lesions of concern.   Review of Systems  Constitutional: Negative.  Negative for activity change and appetite change.  HENT: Negative.   Eyes: Negative.   Respiratory: Negative.   Cardiovascular: Negative.   Gastrointestinal: Negative.   Endocrine: Negative.   Genitourinary: Negative.   Musculoskeletal: Negative.   Skin: Negative.  Negative for wound.  Hematological: Negative.   Psychiatric/Behavioral: Negative.        Objective:   Physical Exam Vitals signs and nursing note reviewed.  Constitutional:      Appearance: Normal appearance.  HENT:     Head: Normocephalic and atraumatic.      Mouth/Throat:     Mouth: Mucous membranes are moist.  Cardiovascular:     Rate and Rhythm: Normal rate.  Pulmonary:     Effort: Pulmonary effort is normal.  Neurological:     General: No focal deficit present.     Mental Status: She is alert and oriented to person, place, and time.  Psychiatric:        Mood and Affect: Mood normal.        Behavior: Behavior normal.        Thought Content: Thought content normal.       Assessment & Plan:  Changing skin lesion   Recommend excision of the changing skin lesion of the nose.    Pictures taken and placed in the chart with the patient's permission.
